# Patient Record
Sex: Male | Born: 1985 | Hispanic: Yes | Marital: Married | Smoking: Former smoker
Health system: Southern US, Community
[De-identification: ages and names within clinical notes are randomized; demographics above are authoritative.]

---
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
## 2011-03-25 ENCOUNTER — Emergency Department: Payer: Self-pay | Admitting: Emergency Medicine

## 2012-04-25 ENCOUNTER — Emergency Department: Payer: Self-pay | Admitting: Emergency Medicine

## 2012-05-06 ENCOUNTER — Emergency Department: Payer: Self-pay | Admitting: Emergency Medicine

## 2013-05-03 ENCOUNTER — Emergency Department: Payer: Self-pay | Admitting: Emergency Medicine

## 2013-08-09 ENCOUNTER — Emergency Department: Payer: Self-pay | Admitting: Emergency Medicine

## 2013-08-09 LAB — URINALYSIS, COMPLETE
Blood: NEGATIVE
Glucose,UR: NEGATIVE mg/dL (ref 0–75)
Leukocyte Esterase: NEGATIVE
Nitrite: NEGATIVE
Ph: 6 (ref 4.5–8.0)
RBC,UR: <1 /HPF (ref 0–5)
Squamous Epithelial: NONE SEEN
WBC UR: <1 /HPF (ref 0–5)

## 2013-08-09 LAB — CBC
MCH: 30.5 pg (ref 26.0–34.0)
MCHC: 35.4 g/dL (ref 32.0–36.0)
MCV: 86 fL (ref 80–100)
Platelet: 227 10*3/uL (ref 150–440)
RBC: 4.62 10*6/uL (ref 4.40–5.90)
RDW: 13.6 % (ref 11.5–14.5)
WBC: 9.1 10*3/uL (ref 3.8–10.6)

## 2013-08-09 LAB — COMPREHENSIVE METABOLIC PANEL
Alkaline Phosphatase: 78 U/L (ref 50–136)
Anion Gap: 5 — ABNORMAL LOW (ref 7–16)
BUN: 10 mg/dL (ref 7–18)
Bilirubin,Total: 0.5 mg/dL (ref 0.2–1.0)
Calcium, Total: 8.7 mg/dL (ref 8.5–10.1)
Creatinine: 0.93 mg/dL (ref 0.60–1.30)
EGFR (Non-African Amer.): >60
Glucose: 97 mg/dL (ref 65–99)
Osmolality: 278 (ref 275–301)
Potassium: 3.7 mmol/L (ref 3.5–5.1)
SGOT(AST): 37 U/L (ref 15–37)
Total Protein: 7.7 g/dL (ref 6.4–8.2)

## 2013-08-09 LAB — LIPASE, BLOOD: Lipase: 96 U/L (ref 73–393)

## 2013-11-30 ENCOUNTER — Emergency Department: Payer: Self-pay | Admitting: Emergency Medicine

## 2014-11-03 ENCOUNTER — Emergency Department: Payer: Self-pay | Admitting: Emergency Medicine

## 2014-11-03 LAB — BASIC METABOLIC PANEL
Anion Gap: 10 (ref 7–16)
BUN: 15 mg/dL (ref 7–18)
CHLORIDE: 106 mmol/L (ref 98–107)
CO2: 27 mmol/L (ref 21–32)
CREATININE: 1.07 mg/dL (ref 0.60–1.30)
Calcium, Total: 8.2 mg/dL — ABNORMAL LOW (ref 8.5–10.1)
EGFR (African American): >60
Glucose: 101 mg/dL — ABNORMAL HIGH (ref 65–99)
Osmolality: 286 (ref 275–301)
Potassium: 3.5 mmol/L (ref 3.5–5.1)
SODIUM: 143 mmol/L (ref 136–145)

## 2014-11-03 LAB — CBC
HCT: 44.1 % (ref 40.0–52.0)
HGB: 15 g/dL (ref 13.0–18.0)
MCH: 30.6 pg (ref 26.0–34.0)
MCHC: 33.9 g/dL (ref 32.0–36.0)
MCV: 90 fL (ref 80–100)
Platelet: 229 10*3/uL (ref 150–440)
RBC: 4.88 10*6/uL (ref 4.40–5.90)
RDW: 13.2 % (ref 11.5–14.5)
WBC: 10.8 10*3/uL — ABNORMAL HIGH (ref 3.8–10.6)

## 2014-11-03 LAB — TROPONIN I: Troponin-I: <0.02 ng/mL

## 2015-06-23 ENCOUNTER — Encounter (HOSPITAL_COMMUNITY): Payer: Self-pay

## 2015-06-23 ENCOUNTER — Emergency Department (HOSPITAL_COMMUNITY)
Admission: EM | Admit: 2015-06-23 | Discharge: 2015-06-23 | Disposition: A | Discharge status: Home / Self Care | Payer: Self-pay | Attending: Emergency Medicine | Admitting: Emergency Medicine

## 2015-06-23 ENCOUNTER — Emergency Department (HOSPITAL_COMMUNITY): Payer: Self-pay

## 2015-06-23 DIAGNOSIS — Z87891 Personal history of nicotine dependence: Secondary | ICD-10-CM | POA: Insufficient documentation

## 2015-06-23 DIAGNOSIS — R1031 Right lower quadrant pain: Secondary | ICD-10-CM | POA: Insufficient documentation

## 2015-06-23 LAB — CBC WITH DIFFERENTIAL/PLATELET
BASOS ABS: 0 10*3/uL (ref 0.0–0.1)
Basophils Relative: 1 % (ref 0–1)
Eosinophils Absolute: 0.3 10*3/uL (ref 0.0–0.7)
Eosinophils Relative: 3 % (ref 0–5)
HEMATOCRIT: 40.5 % (ref 39.0–52.0)
Hemoglobin: 14.3 g/dL (ref 13.0–17.0)
Lymphocytes Relative: 40 % (ref 12–46)
Lymphs Abs: 3.4 10*3/uL (ref 0.7–4.0)
MCH: 30.2 pg (ref 26.0–34.0)
MCHC: 35.3 g/dL (ref 30.0–36.0)
MCV: 85.6 fL (ref 78.0–100.0)
MONO ABS: 0.6 10*3/uL (ref 0.1–1.0)
Monocytes Relative: 7 % (ref 3–12)
Neutro Abs: 4.3 10*3/uL (ref 1.7–7.7)
Neutrophils Relative %: 49 % (ref 43–77)
PLATELETS: 231 10*3/uL (ref 150–400)
RBC: 4.73 MIL/uL (ref 4.22–5.81)
RDW: 13.4 % (ref 11.5–15.5)
WBC: 8.6 10*3/uL (ref 4.0–10.5)

## 2015-06-23 LAB — COMPREHENSIVE METABOLIC PANEL
ALBUMIN: 3.9 g/dL (ref 3.5–5.0)
ALK PHOS: 52 U/L (ref 38–126)
ALT: 32 U/L (ref 17–63)
ANION GAP: 5 (ref 5–15)
AST: 26 U/L (ref 15–41)
BUN: 12 mg/dL (ref 6–20)
CO2: 26 mmol/L (ref 22–32)
CREATININE: 0.93 mg/dL (ref 0.61–1.24)
Calcium: 9.1 mg/dL (ref 8.9–10.3)
Chloride: 108 mmol/L (ref 101–111)
GFR calc non Af Amer: >60 mL/min (ref >60)
GLUCOSE: 105 mg/dL — AB (ref 65–99)
POTASSIUM: 4.4 mmol/L (ref 3.5–5.1)
Sodium: 139 mmol/L (ref 135–145)
Total Bilirubin: 0.6 mg/dL (ref 0.3–1.2)
Total Protein: 7.3 g/dL (ref 6.5–8.1)

## 2015-06-23 LAB — URINALYSIS, ROUTINE W REFLEX MICROSCOPIC
Bilirubin Urine: NEGATIVE
Glucose, UA: NEGATIVE mg/dL
Hgb urine dipstick: NEGATIVE
Ketones, ur: NEGATIVE mg/dL
LEUKOCYTES UA: NEGATIVE
Nitrite: NEGATIVE
PH: 6 (ref 5.0–8.0)
PROTEIN: NEGATIVE mg/dL
SPECIFIC GRAVITY, URINE: 1.024 (ref 1.005–1.030)
UROBILINOGEN UA: 0.2 mg/dL (ref 0.0–1.0)

## 2015-06-23 LAB — LIPASE, BLOOD: Lipase: 18 U/L — ABNORMAL LOW (ref 22–51)

## 2015-06-23 MED ORDER — TRAMADOL HCL 50 MG PO TABS: 50.0000 mg | ORAL_TABLET | Freq: Four times a day (QID) | ORAL | Status: DC | PRN

## 2015-06-23 MED ORDER — SODIUM CHLORIDE 0.9 % IV BOLUS (SEPSIS)
1000.0000 mL | Freq: Once | INTRAVENOUS | Status: AC
  Administered 2015-06-23: 1000 mL via INTRAVENOUS

## 2015-06-23 MED ORDER — IOHEXOL 300 MG/ML  SOLN
100.0000 mL | Freq: Once | INTRAMUSCULAR | Status: AC | PRN
  Administered 2015-06-23: 100 mL via INTRAVENOUS

## 2015-06-23 MED ORDER — HYDROMORPHONE HCL 1 MG/ML IJ SOLN
1.0000 mg | Freq: Once | INTRAMUSCULAR | Status: AC
  Administered 2015-06-23: 1 mg via INTRAVENOUS
  Filled 2015-06-23: qty 1

## 2015-06-23 MED ORDER — IOHEXOL 300 MG/ML  SOLN
25.0000 mL | Freq: Once | INTRAMUSCULAR | Status: AC | PRN
  Administered 2015-06-23: 25 mL via ORAL

## 2015-06-23 MED ORDER — ONDANSETRON HCL 4 MG/2ML IJ SOLN
4.0000 mg | Freq: Once | INTRAMUSCULAR | Status: AC
  Administered 2015-06-23: 4 mg via INTRAVENOUS
  Filled 2015-06-23: qty 2

## 2015-06-23 NOTE — ED Notes (Signed)
Pt reports onset yesterday intermittant RLQ abd pain radiating to mid lower abd and nausea.

## 2015-06-27 NOTE — ED Provider Notes (Signed)
CSN: [id number]     Arrival date & time 06/23/15  1120 History   First MD Initiated Contact with Patient 06/23/15 1138     Chief Complaint  Patient presents with  . Abdominal Pain     (Consider location/radiation/quality/duration/timing/severity/associated sxs/prior Treatment) HPI   29 year old male with right lower quadrant pain. Onset yesterday. Initially intermittent. Now is fairly constant. Waxes and wanes but doesn't completely go away. Sharp at times and dull ache and others. No appreciable exacerbating relieving factors. No fevers or chills. No nausea or vomiting. No history of similar type pain. No urinary complaints. History reviewed. No pertinent past medical history. History reviewed. No pertinent past surgical history. History reviewed. No pertinent family history. History  Substance Use Topics  . Smoking status: Former Games developermoker  . Smokeless tobacco: Not on file  . Alcohol Use: No    Review of Systems  All systems reviewed and negative, other than as noted in HPI.   Allergies  Review of patient's allergies indicates no known allergies.  Home Medications   Prior to Admission medications   Medication Sig Start Date End Date Taking? Authorizing Provider  traMADol (ULTRAM) 50 MG tablet Take 1 tablet (50 mg total) by mouth every 6 (six) hours as needed. 06/23/15   Raeford RazorStephen Sandor Arboleda, MD   BP 102/65 mmHg  Pulse 58  Temp(Src) 97.5 F (36.4 C) (Oral)  Resp 18  SpO2 99% Physical Exam  Constitutional: He appears well-developed and well-nourished. No distress.  HENT:  Head: Normocephalic and atraumatic.  Eyes: Conjunctivae are normal. Right eye exhibits no discharge. Left eye exhibits no discharge.  Neck: Neck supple.  Cardiovascular: Normal rate, regular rhythm and normal heart sounds.  Exam reveals no gallop and no friction rub.   No murmur heard. Pulmonary/Chest: Effort normal and breath sounds normal. No respiratory distress.  Abdominal: Soft. He exhibits no  distension. There is tenderness.  Tenderness suprapubically in the right lower quadrant. No rebound or guarding. No distention. No hernia appreciated. No testicular tenderness or abnormal lie.  Musculoskeletal: He exhibits no edema or tenderness.  Neurological: He is alert.  Skin: Skin is warm and dry.  Psychiatric: He has a normal mood and affect. His behavior is normal. Thought content normal.  Nursing note and vitals reviewed.   ED Course  Procedures (including critical care time) Labs Review Labs Reviewed  COMPREHENSIVE METABOLIC PANEL - Abnormal; Notable for the following:    Glucose, Bld 105 (*)    All other components within normal limits  LIPASE, BLOOD - Abnormal; Notable for the following:    Lipase 18 (*)    All other components within normal limits  URINALYSIS, ROUTINE W REFLEX MICROSCOPIC (NOT [facility]) - Abnormal; Notable for the following:    APPearance CLOUDY (*)    All other components within normal limits  CBC WITH DIFFERENTIAL/PLATELET    Imaging Review No results found.   EKG Interpretation None      MDM   Final diagnoses:  Right lower quadrant abdominal pain   29 year old male with right lower quadrant pain. Etiology not completely clear. No hernia on exam. UA is unremarkable. CT abdomen and pelvis without acute abnormality. Does not appear to be emergent process. I feel is stable for discharge at this time. Return precautions were discussed.    Raeford RazorStephen Mikisha Roseland, MD 06/27/15 [phone]

## 2016-09-01 ENCOUNTER — Emergency Department
Admission: EM | Admit: 2016-09-01 | Discharge: 2016-09-01 | Disposition: A | Discharge status: Home / Self Care | Payer: Self-pay | Attending: Emergency Medicine | Admitting: Emergency Medicine

## 2016-09-01 ENCOUNTER — Emergency Department: Payer: Self-pay

## 2016-09-01 DIAGNOSIS — Z87891 Personal history of nicotine dependence: Secondary | ICD-10-CM | POA: Insufficient documentation

## 2016-09-01 DIAGNOSIS — R1084 Generalized abdominal pain: Secondary | ICD-10-CM

## 2016-09-01 LAB — COMPREHENSIVE METABOLIC PANEL
ALT: 39 U/L (ref 17–63)
ANION GAP: 6 (ref 5–15)
AST: 30 U/L (ref 15–41)
Albumin: 4.7 g/dL (ref 3.5–5.0)
Alkaline Phosphatase: 57 U/L (ref 38–126)
BUN: 17 mg/dL (ref 6–20)
CHLORIDE: 109 mmol/L (ref 101–111)
CO2: 23 mmol/L (ref 22–32)
Calcium: 9.4 mg/dL (ref 8.9–10.3)
Creatinine, Ser: 0.95 mg/dL (ref 0.61–1.24)
GFR calc Af Amer: >60 mL/min (ref >60)
GFR calc non Af Amer: >60 mL/min (ref >60)
Glucose, Bld: 105 mg/dL — ABNORMAL HIGH (ref 65–99)
Potassium: 4.1 mmol/L (ref 3.5–5.1)
SODIUM: 138 mmol/L (ref 135–145)
Total Bilirubin: 0.7 mg/dL (ref 0.3–1.2)
Total Protein: 8.2 g/dL — ABNORMAL HIGH (ref 6.5–8.1)

## 2016-09-01 LAB — URINALYSIS COMPLETE WITH MICROSCOPIC (ARMC ONLY)
BACTERIA UA: NONE SEEN
Bilirubin Urine: NEGATIVE
Glucose, UA: NEGATIVE mg/dL
Hgb urine dipstick: NEGATIVE
Ketones, ur: NEGATIVE mg/dL
Leukocytes, UA: NEGATIVE
Nitrite: NEGATIVE
PH: 5 (ref 5.0–8.0)
PROTEIN: NEGATIVE mg/dL
SQUAMOUS EPITHELIAL / LPF: NONE SEEN
Specific Gravity, Urine: 1.021 (ref 1.005–1.030)

## 2016-09-01 LAB — CBC
HCT: 44.2 % (ref 40.0–52.0)
Hemoglobin: 15.7 g/dL (ref 13.0–18.0)
MCH: 30.8 pg (ref 26.0–34.0)
MCHC: 35.4 g/dL (ref 32.0–36.0)
MCV: 87 fL (ref 80.0–100.0)
Platelets: 245 10*3/uL (ref 150–440)
RBC: 5.08 MIL/uL (ref 4.40–5.90)
RDW: 13.4 % (ref 11.5–14.5)
WBC: 9.5 10*3/uL (ref 3.8–10.6)

## 2016-09-01 LAB — TROPONIN I: Troponin I: <0.03 ng/mL (ref <0.03)

## 2016-09-01 LAB — LIPASE, BLOOD: LIPASE: 24 U/L (ref 11–51)

## 2016-09-01 MED ORDER — RANITIDINE HCL 150 MG PO TABS: 150.0000 mg | ORAL_TABLET | Freq: Two times a day (BID) | ORAL | 0 refills | Status: DC

## 2016-09-01 MED ORDER — MORPHINE SULFATE (PF) 4 MG/ML IV SOLN
4.0000 mg | Freq: Once | INTRAVENOUS | Status: AC
  Administered 2016-09-01: 4 mg via INTRAVENOUS

## 2016-09-01 MED ORDER — ONDANSETRON HCL 4 MG/2ML IJ SOLN
4.0000 mg | Freq: Once | INTRAMUSCULAR | Status: AC
  Administered 2016-09-01: 4 mg via INTRAVENOUS

## 2016-09-01 MED ORDER — ONDANSETRON HCL 4 MG/2ML IJ SOLN
INTRAMUSCULAR | Status: AC
  Administered 2016-09-01: 4 mg via INTRAVENOUS
  Filled 2016-09-01: qty 2

## 2016-09-01 MED ORDER — IOPAMIDOL (ISOVUE-300) INJECTION 61%
30.0000 mL | Freq: Once | INTRAVENOUS | Status: AC | PRN
  Administered 2016-09-01: 30 mL via ORAL

## 2016-09-01 MED ORDER — OXYCODONE-ACETAMINOPHEN 5-325 MG PO TABS: 1.0000 | ORAL_TABLET | ORAL | Status: DC | PRN

## 2016-09-01 MED ORDER — IOPAMIDOL (ISOVUE-300) INJECTION 61%
100.0000 mL | Freq: Once | INTRAVENOUS | Status: AC | PRN
  Administered 2016-09-01: 100 mL via INTRAVENOUS

## 2016-09-01 MED ORDER — MORPHINE SULFATE (PF) 4 MG/ML IV SOLN
INTRAVENOUS | Status: AC
  Administered 2016-09-01: 4 mg via INTRAVENOUS
  Filled 2016-09-01: qty 1

## 2016-09-01 MED ORDER — HYDROMORPHONE HCL 1 MG/ML IJ SOLN
1.0000 mg | Freq: Once | INTRAMUSCULAR | Status: AC
  Administered 2016-09-01: 1 mg via INTRAVENOUS
  Filled 2016-09-01: qty 1

## 2016-09-01 NOTE — ED Triage Notes (Signed)
Pt c/o mid/upper abd pain since yesterday .Marland Kitchen. Denies N/V/D, states last BM was last night, normal.. Pt states pain is worse with movement.. States he tried Pepto without any relief

## 2016-09-01 NOTE — ED Notes (Signed)
MD at bedside. 

## 2016-09-01 NOTE — ED Provider Notes (Signed)
[facility] Emergency Department Provider Note ____________________________________________   I have reviewed the triage vital signs and the triage nursing note.  HISTORY  Chief Complaint Abdominal Pain   Historian Patient and his friend  HPI Tony Shelton is a 30 y.o. male here for evaluation of mid abdominal pain which started last night and has persisted although somewhat waxing and waning over the course of the night and the day today. He's had nausea without vomiting. No black or bloody stools. No diarrhea. No fever. No history of similar abdominal pain. Friend states that they pressed on his abdomen and although he is pointing to his epigastrium, he is tender all over. Pain is moderate to severe and persistent at present time.  Movement seems to make it worse.    History reviewed. No pertinent past medical history.  There are no active problems to display for this patient.   History reviewed. No pertinent surgical history.  Prior to Admission medications   Medication Sig Start Date End Date Taking? Authorizing Provider  traMADol (ULTRAM) 50 MG tablet Take 1 tablet (50 mg total) by mouth every 6 (six) hours as needed. 06/23/15   Raeford Razor, MD    No Known Allergies  No family history on file.  Social History Social History  Substance Use Topics  . Smoking status: Former Games developer  . Smokeless tobacco: Never Used  . Alcohol use Yes    Review of Systems  Constitutional: Negative for fever. Eyes: Negative for visual changes. ENT: Negative for sore throat. Cardiovascular: Negative for chest pain. Respiratory: Negative for shortness of breath. Gastrointestinal: Nausea for abdominal pain, without vomiting and diarrhea. Genitourinary: Negative for dysuria. Musculoskeletal: Negative for back pain. Skin: Negative for rash. Neurological: Negative for headache. 10 point Review of Systems otherwise  negative ____________________________________________   PHYSICAL EXAM:  VITAL SIGNS: ED Triage Vitals [09/01/16 1319]  Enc Vitals Group     BP 122/67     Pulse Rate 76     Resp 18     Temp 97.6 F (36.4 C)     Temp Source Oral     SpO2 98 %     Weight 240 lb (108.9 kg)     Height 5\' 4"  (1.626 m)     Head Circumference      Peak Flow      Pain Score 10     Pain Loc      Pain Edu?      Excl. in GC?      Constitutional: Alert and oriented. Well appearing Overall the company of abdominal pain. HEENT   Head: Normocephalic and atraumatic.      Eyes: Conjunctivae are normal. PERRL. Normal extraocular movements.      Ears:         Nose: No congestion/rhinnorhea.   Mouth/Throat: Mucous membranes are moist.   Neck: No stridor. Cardiovascular/Chest: Normal rate, regular rhythm.  No murmurs, rubs, or gallops. Respiratory: Normal respiratory effort without tachypnea nor retractions. Breath sounds are clear and equal bilaterally. No wheezes/rales/rhonchi. Gastrointestinal: Soft.  Mild distended and diffusely tender in all 4 quadrants without guarding, rebound.  Genitourinary/rectal:Deferred  Musculoskeletal: Nontender with normal range of motion in all extremities. No joint effusions.  No lower extremity tenderness.  No edema. Neurologic:  Normal speech and language. No gross or focal neurologic deficits are appreciated. Skin:  Skin is warm, dry and intact. No rash noted. Psychiatric: Mood and affect are normal. Speech and behavior are normal. Patient exhibits appropriate  insight and judgment.  ____________________________________________   EKG I, Governor Rooksebecca Cameo Shewell, MD, the attending physician have personally viewed and interpreted all ECGs.  70 bpm. Normal sinus rhythm. Narrow distress. Normal axis. Nonspecific T wave. ____________________________________________  LABS (pertinent positives/negatives)  Labs Reviewed  COMPREHENSIVE METABOLIC PANEL - Abnormal; Notable for  the following:       Result Value   Glucose, Bld 105 (*)    Total Protein 8.2 (*)    All other components within normal limits  URINALYSIS COMPLETEWITH MICROSCOPIC ([facility] ONLY) - Abnormal; Notable for the following:    Color, Urine YELLOW (*)    APPearance CLEAR (*)    All other components within normal limits  LIPASE, BLOOD  CBC  TROPONIN I    ____________________________________________  RADIOLOGY All Xrays were viewed by me. Imaging interpreted by Radiologist.  CT abdomen and pelvis with contrast: Pending __________________________________________  PROCEDURES  Procedure(s) performed: None  Critical Care performed: None  ____________________________________________   ED COURSE / ASSESSMENT AND PLAN  Pertinent labs & imaging results that were available during my care of the patient were reviewed by me and considered in my medical decision making (see chart for details).   Tony Shelton is here with abdominal pain overnight. When he was discussing his history, it sounded like potentially indigestion/GERD, but on exam, he is diffusely tender in his abdomen, enough so that I am recommending CT scan for further evaluation.  Laboratory studies are reassuring.  No focal RUQ pain, LFTs normal, and I am less suspicious for a biliary emergency.  Patient care transferred to Dr. Langston MaskerShaevitz at shift change, 3:15 PM. If CT scan is reassuring, my impression is gastritis/indigestion.   CONSULTATIONS:  None   Patient / Family / Caregiver informed of clinical course, medical decision-making process, and agree with plan.  ___________________________________________   FINAL CLINICAL IMPRESSION(S) / ED DIAGNOSES   Final diagnoses:  Generalized abdominal pain              Note: This dictation was prepared with Dragon dictation. Any transcriptional errors that result from this process are unintentional    Governor Rooksebecca Jariyah Hackley, MD 09/01/16 1513

## 2016-09-01 NOTE — ED Notes (Signed)
Pt denies n/v/d 

## 2016-09-01 NOTE — ED Provider Notes (Signed)
Patient signed out from Dr. Shaune PollackLord with diffuse abdominal tenderness. Very reassuring lab work with a history that led Dr. Shaune PollackLord to think that the patient was having gastritis because of him pointing to his epigastrium when he was asked to identify the location of his pain. Plan is to follow up with the patient's CAT scan results. The patient has a reassuring CAT scan then the plan is to discharge home with an antacid. Physical Exam  BP 119/78   Pulse (!) 52   Temp 97.6 F (36.4 C) (Oral)   Resp 15   Ht 5\' 4"  (1.626 m)   Wt 240 lb (108.9 kg)   SpO2 100%   BMI 41.20 kg/m  ----------------------------------------- 4:22 PM on 09/01/2016 -----------------------------------------   Physical Exam Patient still diffusely tender but very distractible and when I am speaking with him and pushing on his belly at the same time he has no reaction. Belly is soft and without any focal tenderness. ED Course  Procedures  CT Abdomen Pelvis W Contrast (Accession [id number]) (Order [id number])  Imaging  Date: 09/01/2016 Department: [facility] EMERGENCY DEPARTMENT Released By/Authorizing: Governor Rooksebecca Lord, MD (auto-released)  PACS Images   Show images for CT Abdomen Pelvis W Contrast  Study Result   CLINICAL DATA:  Mid to upper abdominal pain since yesterday.  EXAM: CT ABDOMEN AND PELVIS WITH CONTRAST  TECHNIQUE: Multidetector CT imaging of the abdomen and pelvis was performed using the standard protocol following bolus administration of intravenous contrast.  CONTRAST:  100mL ISOVUE-300 IOPAMIDOL (ISOVUE-300) INJECTION 61%  COMPARISON:  06/23/2015  FINDINGS: Lower chest: The lung bases are clear of acute process. Minimal dependent subpleural atelectasis No pleural effusion or pulmonary lesions. The heart is normal in size. No pericardial effusion. The distal esophagus and aorta are unremarkable.  Hepatobiliary: Diffuse fatty infiltration of the liver. No focal hepatic  lesions or intrahepatic biliary dilatation. The gallbladder is normal. No common bile duct dilatation.  Pancreas: No mass, inflammation or ductal dilatation.  Spleen: Normal size.  No focal lesions.  Adrenals/Urinary Tract: The adrenal glands and kidneys are normal.  Stomach/Bowel: The stomach, duodenum, small bowel and colon are unremarkable. No inflammatory changes, mass lesions or obstructive findings. The terminal ileum is normal. The appendix is normal.  Vascular/Lymphatic: The aorta is normal in caliber. No atherosclerotic calcifications. The branch vessels are normal. The major venous structures are patent. There are small scattered mesenteric and retroperitoneal lymph nodes but no mass or overt adenopathy. These are stable.  Other: The bladder, prostate gland and seminal vesicles are normal. No pelvic mass or adenopathy. No free pelvic fluid collections. No inguinal mass or adenopathy.  Musculoskeletal: No significant bony findings.  IMPRESSION: No acute abdominal/ pelvic findings, mass lesions or adenopathy. No CT findings to account for the patient's abdominal pain are identified.   Electronically Signed   By: Rudie MeyerP.  Gallerani M.D.   On: 09/01/2016 15:25      MDM Patient with very reassuring CAT scan as well as blood work and EKG. Will be discharged home with Zantac. I also asked the patient if there is any possibility had done any heavy lifting or bending that would've caused the abdominal wall strain and he denies. We reviewed his imaging study and he is understanding of the plan and willing to comply. Will be following up with Phineas Realharles Drew.       Myrna Blazeravid Matthew Ayliana Casciano, MD 09/01/16 [phone]

## 2018-02-15 ENCOUNTER — Ambulatory Visit (INDEPENDENT_AMBULATORY_CARE_PROVIDER_SITE_OTHER): Payer: Self-pay | Admitting: Physician Assistant

## 2018-02-15 ENCOUNTER — Encounter (INDEPENDENT_AMBULATORY_CARE_PROVIDER_SITE_OTHER): Payer: Self-pay | Admitting: Physician Assistant

## 2018-02-15 VITALS — BP 127/84 | HR 70 | Temp 97.9°F | Resp 18 | Ht 64.96 in | Wt 257.0 lb

## 2018-02-15 DIAGNOSIS — R5383 Other fatigue: Secondary | ICD-10-CM

## 2018-02-15 DIAGNOSIS — F0630 Mood disorder due to known physiological condition, unspecified: Secondary | ICD-10-CM

## 2018-02-15 DIAGNOSIS — Z131 Encounter for screening for diabetes mellitus: Secondary | ICD-10-CM

## 2018-02-15 DIAGNOSIS — Z6841 Body Mass Index (BMI) 40.0 and over, adult: Secondary | ICD-10-CM

## 2018-02-15 DIAGNOSIS — R519 Headache, unspecified: Secondary | ICD-10-CM

## 2018-02-15 DIAGNOSIS — R51 Headache: Secondary | ICD-10-CM

## 2018-02-15 DIAGNOSIS — R079 Chest pain, unspecified: Secondary | ICD-10-CM

## 2018-02-15 LAB — POCT GLYCOSYLATED HEMOGLOBIN (HGB A1C): Hemoglobin A1C: 5.4

## 2018-02-15 MED ORDER — TOPIRAMATE 25 MG PO TABS: 25.0000 mg | ORAL_TABLET | Freq: Two times a day (BID) | ORAL | 2 refills | Status: DC

## 2018-02-15 MED ORDER — VITAMIN D-3 125 MCG (5000 UT) PO TABS: 1.0000 | ORAL_TABLET | Freq: Every day | ORAL | 0 refills | Status: DC

## 2018-02-15 NOTE — Progress Notes (Signed)
Subjective:  Patient ID: Tony Shelton, male    DOB: [birth year]  Age: 32 y.o. MRN: [id number]  CC: establish care  HPI Tony Shelton is a 32 y.o. male with no significant medical history presents with complaint of fatigue daily bitemporal headaches that are sometimes relieved with ibuprofen. Headache is sometimes worse if he does not have coffee or sugary drink in the morning. Has been having occasional diaphoresis. Rare feeling of chest "cramp" that lasts for approximately two minutes, last episode within six months ago. Thinks he may have lost consciousness one time three days ago. Reports feeling "really hot and having a headache" before witnessed syncope and collapse. Did not go to the hospital. Had cold like symptoms approximately two weeks. Does not endorse current CP, palpitations, SOB, substance abuse, swelling, abdominal pain, rash, f/c/n/v, or GI/GU sxs. French Ana, a friend for 8 years, accompanies patient and says pt has been having mood swings over the past six months. PHQ9 16 and GAD7 11 in clinic today. Has been highly irritable and irritation/anger can be explosive.      Outpatient Medications Prior to Visit  Medication Sig Dispense Refill  . ranitidine (ZANTAC) 150 MG tablet Take 1 tablet (150 mg total) by mouth 2 (two) times daily. 60 tablet 0  . traMADol (ULTRAM) 50 MG tablet Take 1 tablet (50 mg total) by mouth every 6 (six) hours as needed. (Patient not taking: Reported on 02/15/2018) 8 tablet 0   No facility-administered medications prior to visit.      ROS Review of Systems  Constitutional: Positive for malaise/fatigue. Negative for chills and fever.  Eyes: Negative for blurred vision.  Respiratory: Negative for shortness of breath.   Cardiovascular: Positive for chest pain. Negative for palpitations.  Gastrointestinal: Negative for abdominal pain and nausea.  Genitourinary: Negative for dysuria and hematuria.  Musculoskeletal: Negative for  joint pain and myalgias.  Skin: Negative for rash.  Neurological: Positive for headaches. Negative for tingling.  Endo/Heme/Allergies:       Diaphoresis  Psychiatric/Behavioral: Negative for depression and substance abuse. The patient is nervous/anxious.     Objective:  BP 127/84 (BP Location: Right Arm, Patient Position: Sitting, Cuff Size: Large)   Pulse 70   Temp 97.9 F (36.6 C) (Oral)   Resp 18   Ht 5' 4.96" (1.65 m)   Wt 257 lb (116.6 kg)   SpO2 99%   BMI 42.82 kg/m   BP/Weight 02/15/2018 09/01/2016 06/23/2015  Systolic BP 127 109 102  Diastolic BP 84 69 65  Wt. (Lbs) 257 240 -  BMI 42.82 41.2 -      Physical Exam  Constitutional: He is oriented to person, place, and time.  Well developed, obese, NAD, polite  HENT:  Head: Normocephalic and atraumatic.  Eyes: Conjunctivae are normal. Pupils are equal, round, and reactive to light. No scleral icterus.  Neck: Normal range of motion. Neck supple. No thyromegaly present.  Cardiovascular: Normal rate, regular rhythm, normal heart sounds and intact distal pulses.  No LE edema bilaterally  Pulmonary/Chest: Effort normal and breath sounds normal. No respiratory distress. He has no wheezes.  Abdominal: Soft. Bowel sounds are normal. He exhibits no distension. There is no tenderness.  No hepatosplenomegaly  Musculoskeletal: He exhibits no edema.  Lymphadenopathy:    He has no cervical adenopathy.  Neurological: He is alert and oriented to person, place, and time. No cranial nerve deficit. Coordination normal.  Skin: Skin is warm and dry. No rash noted. No erythema. No pallor.  Clammy hands. Not generally diaphoretic.  Psychiatric: He has a normal mood and affect. His behavior is normal. Thought content normal.  Vitals reviewed.    Assessment & Plan:   1. Nonintractable headache, unspecified chronicity pattern, unspecified headache type - Begin topiramate (TOPAMAX) 25 MG tablet; Take 1 tablet (25 mg total) by mouth 2  (two) times daily.  Dispense: 30 tablet; Refill: 2 - Comprehensive metabolic panel - CBC with Differential - TSH - Lipid panel  2. Fatigue, unspecified type - TSH - Lipid panel - Begin Cholecalciferol (VITAMIN D-3) 5000 units TABS; Take 1 tablet by mouth daily.  Dispense: 30 tablet; Refill: 0  3. Chest pain, unspecified type - EKG 12-Lead. Sent to [facility]  4. Class 3 severe obesity without serious comorbidity with body mass index (BMI) of 40.0 to 44.9 in adult, unspecified obesity type (HCC) - TSH - Lipid panel  5. Screening for diabetes mellitus - HgB A1c 5.4%  6. Mood disorder in conditions classified elsewhere  - PHQ9 16 and GAD7 11 in clinic today. Can not yet r/o thyroid etiology of mood disorder.   Meds ordered this encounter  Medications  . topiramate (TOPAMAX) 25 MG tablet    Sig: Take 1 tablet (25 mg total) by mouth 2 (two) times daily.    Dispense:  30 tablet    Refill:  2    Order Specific Question:   Supervising Provider    Answer:   Quentin AngstJEGEDE, OLUGBEMIGA E [id number]]  . Cholecalciferol (VITAMIN D-3) 5000 units TABS    Sig: Take 1 tablet by mouth daily.    Dispense:  30 tablet    Refill:  0    Order Specific Question:   Supervising Provider    Answer:   Quentin AngstJEGEDE, OLUGBEMIGA E [[id number]]    Follow-up: Return in about 2 weeks (around 03/01/2018) for Headache f/u, general health maintenance.   Loletta Specteroger David Mazin Emma PA

## 2018-02-15 NOTE — Patient Instructions (Addendum)
General Headache Without Cause A headache is pain or discomfort felt around the head or neck area. There are many causes and types of headaches. In some cases, the cause may not be found. Follow these instructions at home: Managing pain  Take over-the-counter and prescription medicines only as told by your doctor.  Lie down in a dark, quiet room when you have a headache.  If directed, apply ice to the head and neck area: ? Put ice in a plastic bag. ? Place a towel between your skin and the bag. ? Leave the ice on for 20 minutes, 2-3 times per day.  Use a heating pad or hot shower to apply heat to the head and neck area as told by your doctor.  Keep lights dim if bright lights bother you or make your headaches worse. Eating and drinking  Eat meals on a regular schedule.  Lessen how much alcohol you drink.  Lessen how much caffeine you drink, or stop drinking caffeine. General instructions  Keep all follow-up visits as told by your doctor. This is important.  Keep a journal to find out if certain things bring on headaches. For example, write down: ? What you eat and drink. ? How much sleep you get. ? Any change to your diet or medicines.  Relax by getting a massage or doing other relaxing activities.  Lessen stress.  Sit up straight. Do not tighten (tense) your muscles.  Do not use tobacco products. This includes cigarettes, chewing tobacco, or e-cigarettes. If you need help quitting, ask your doctor.  Exercise regularly as told by your doctor.  Get enough sleep. This often means 7-9 hours of sleep. Contact a doctor if:  Your symptoms are not helped by medicine.  You have a headache that feels different than the other headaches.  You feel sick to your stomach (nauseous) or you throw up (vomit).  You have a fever. Get help right away if:  Your headache becomes really bad.  You keep throwing up.  You have a stiff neck.  You have trouble seeing.  You have  trouble speaking.  You have pain in the eye or ear.  Your muscles are weak or you lose muscle control.  You lose your balance or have trouble walking.  You feel like you will pass out (faint) or you pass out.  You have confusion. This information is not intended to replace advice given to you by your health care provider. Make sure you discuss any questions you have with your health care provider. Document Released: 09/23/2008 Document Revised: 05/22/2016 Document Reviewed: 04/09/2015 Elsevier Interactive Patient Education  2018 Elsevier Inc.   Generalized Anxiety Disorder, Adult Generalized anxiety disorder (GAD) is a mental health disorder. People with this condition constantly worry about everyday events. Unlike normal anxiety, worry related to GAD is not triggered by a specific event. These worries also do not fade or get better with time. GAD interferes with life functions, including relationships, work, and school. GAD can vary from mild to severe. People with severe GAD can have intense waves of anxiety with physical symptoms (panic attacks). What are the causes? The exact cause of GAD is not known. What increases the risk? This condition is more likely to develop in:  Women.  People who have a family history of anxiety disorders.  People who are very shy.  People who experience very stressful life events, such as the death of a loved one.  People who have a very stressful family environment.  What are the signs or symptoms? People with GAD often worry excessively about many things in their lives, such as their health and family. They may also be overly concerned about:  Doing well at work.  Being on time.  Natural disasters.  Friendships.  Physical symptoms of GAD include:  Fatigue.  Muscle tension or having muscle twitches.  Trembling or feeling shaky.  Being easily startled.  Feeling like your heart is pounding or racing.  Feeling out of breath or  like you cannot take a deep breath.  Having trouble falling asleep or staying asleep.  Sweating.  Nausea, diarrhea, or irritable bowel syndrome (IBS).  Headaches.  Trouble concentrating or remembering facts.  Restlessness.  Irritability.  How is this diagnosed? Your health care provider can diagnose GAD based on your symptoms and medical history. You will also have a physical exam. The health care provider will ask specific questions about your symptoms, including how severe they are, when they started, and if they come and go. Your health care provider may ask you about your use of alcohol or drugs, including prescription medicines. Your health care provider may refer you to a mental health specialist for further evaluation. Your health care provider will do a thorough examination and may perform additional tests to rule out other possible causes of your symptoms. To be diagnosed with GAD, a person must have anxiety that:  Is out of his or her control.  Affects several different aspects of his or her life, such as work and relationships.  Causes distress that makes him or her unable to take part in normal activities.  Includes at least three physical symptoms of GAD, such as restlessness, fatigue, trouble concentrating, irritability, muscle tension, or sleep problems.  Before your health care provider can confirm a diagnosis of GAD, these symptoms must be present more days than they are not, and they must last for six months or longer. How is this treated? The following therapies are usually used to treat GAD:  Medicine. Antidepressant medicine is usually prescribed for long-term daily control. Antianxiety medicines may be added in severe cases, especially when panic attacks occur.  Talk therapy (psychotherapy). Certain types of talk therapy can be helpful in treating GAD by providing support, education, and guidance. Options include: ? Cognitive behavioral therapy (CBT). People  learn coping skills and techniques to ease their anxiety. They learn to identify unrealistic or negative thoughts and behaviors and to replace them with positive ones. ? Acceptance and commitment therapy (ACT). This treatment teaches people how to be mindful as a way to cope with unwanted thoughts and feelings. ? Biofeedback. This process trains you to manage your body's response (physiological response) through breathing techniques and relaxation methods. You will work with a therapist while machines are used to monitor your physical symptoms.  Stress management techniques. These include yoga, meditation, and exercise.  A mental health specialist can help determine which treatment is best for you. Some people see improvement with one type of therapy. However, other people require a combination of therapies. Follow these instructions at home:  Take over-the-counter and prescription medicines only as told by your health care provider.  Try to maintain a normal routine.  Try to anticipate stressful situations and allow extra time to manage them.  Practice any stress management or self-calming techniques as taught by your health care provider.  Do not punish yourself for setbacks or for not making progress.  Try to recognize your accomplishments, even if they are small.  Keep all follow-up visits as told by your health care provider. This is important. Contact a health care provider if:  Your symptoms do not get better.  Your symptoms get worse.  You have signs of depression, such as: ? A persistently sad, cranky, or irritable mood. ? Loss of enjoyment in activities that used to bring you joy. ? Change in weight or eating. ? Changes in sleeping habits. ? Avoiding friends or family members. ? Loss of energy for normal tasks. ? Feelings of guilt or worthlessness. Get help right away if:  You have serious thoughts about hurting yourself or others. If you ever feel like you may hurt  yourself or others, or have thoughts about taking your own life, get help right away. You can go to your nearest emergency department or call:  Your local emergency services (911 in the [place]  A suicide crisis helpline, such as the National Suicide Prevention Lifeline at (831)204-51061-308-202-4829. This is open 24 hours a day.  Summary  Generalized anxiety disorder (GAD) is a mental health disorder that involves worry that is not triggered by a specific event.  People with GAD often worry excessively about many things in their lives, such as their health and family.  GAD may cause physical symptoms such as restlessness, trouble concentrating, sleep problems, frequent sweating, nausea, diarrhea, headaches, and trembling or muscle twitching.  A mental health specialist can help determine which treatment is best for you. Some people see improvement with one type of therapy. However, other people require a combination of therapies. This information is not intended to replace advice given to you by your health care provider. Make sure you discuss any questions you have with your health care provider. Document Released: 04/11/2013 Document Revised: 11/04/2016 Document Reviewed: 11/04/2016 Elsevier Interactive Patient Education  [employer]2018 Elsevier Inc.

## 2018-02-16 ENCOUNTER — Telehealth (INDEPENDENT_AMBULATORY_CARE_PROVIDER_SITE_OTHER): Payer: Self-pay | Admitting: *Deleted

## 2018-02-16 LAB — COMPREHENSIVE METABOLIC PANEL
ALT: 60 IU/L — ABNORMAL HIGH (ref 0–44)
AST: 42 IU/L — AB (ref 0–40)
Albumin/Globulin Ratio: 1.4 (ref 1.2–2.2)
Albumin: 4.6 g/dL (ref 3.5–5.5)
Alkaline Phosphatase: 70 IU/L (ref 39–117)
BUN/Creatinine Ratio: 9 (ref 9–20)
BUN: 8 mg/dL (ref 6–20)
Bilirubin Total: 0.4 mg/dL (ref 0.0–1.2)
CO2: 23 mmol/L (ref 20–29)
CREATININE: 0.9 mg/dL (ref 0.76–1.27)
Calcium: 9.3 mg/dL (ref 8.7–10.2)
Chloride: 102 mmol/L (ref 96–106)
GFR, EST AFRICAN AMERICAN: 133 mL/min/{1.73_m2} (ref >59)
GFR, EST NON AFRICAN AMERICAN: 115 mL/min/{1.73_m2} (ref >59)
GLUCOSE: 86 mg/dL (ref 65–99)
Globulin, Total: 3.2 g/dL (ref 1.5–4.5)
POTASSIUM: 4.6 mmol/L (ref 3.5–5.2)
Sodium: 141 mmol/L (ref 134–144)
TOTAL PROTEIN: 7.8 g/dL (ref 6.0–8.5)

## 2018-02-16 LAB — CBC WITH DIFFERENTIAL/PLATELET
BASOS: 0 %
Basophils Absolute: 0 10*3/uL (ref 0.0–0.2)
EOS (ABSOLUTE): 0.2 10*3/uL (ref 0.0–0.4)
Eos: 3 %
HEMOGLOBIN: 15.2 g/dL (ref 13.0–17.7)
Hematocrit: 43.8 % (ref 37.5–51.0)
Immature Grans (Abs): 0 10*3/uL (ref 0.0–0.1)
Immature Granulocytes: 0 %
LYMPHS: 41 %
Lymphocytes Absolute: 3 10*3/uL (ref 0.7–3.1)
MCH: 30.9 pg (ref 26.6–33.0)
MCHC: 34.7 g/dL (ref 31.5–35.7)
MCV: 89 fL (ref 79–97)
Monocytes Absolute: 0.4 10*3/uL (ref 0.1–0.9)
Monocytes: 5 %
NEUTROS ABS: 3.7 10*3/uL (ref 1.4–7.0)
NEUTROS PCT: 51 %
Platelets: 278 10*3/uL (ref 150–379)
RBC: 4.92 x10E6/uL (ref 4.14–5.80)
RDW: 14.1 % (ref 12.3–15.4)
WBC: 7.3 10*3/uL (ref 3.4–10.8)

## 2018-02-16 LAB — LIPID PANEL
CHOL/HDL RATIO: 3.5 ratio (ref 0.0–5.0)
Cholesterol, Total: 159 mg/dL (ref 100–199)
HDL: 45 mg/dL (ref >39)
LDL Calculated: 94 mg/dL (ref 0–99)
Triglycerides: 99 mg/dL (ref 0–149)
VLDL CHOLESTEROL CAL: 20 mg/dL (ref 5–40)

## 2018-02-16 LAB — TSH: TSH: 1.38 u[IU]/mL (ref 0.450–4.500)

## 2018-02-16 NOTE — Telephone Encounter (Signed)
-----   Message from Loletta Specteroger David Gomez, PA-C sent at 02/16/2018  1:50 PM EST ----- Signs of liver inflammation. Rest of labs normal. I will f/u on liver enzymes at his f/u in a few weeks.

## 2018-02-16 NOTE — Telephone Encounter (Signed)
Patient verified DOB Patient is aware of liver inflammation being noted and PCP wanting to recheck at the next visit. All other lab are normal No further questions.

## 2018-03-01 ENCOUNTER — Ambulatory Visit (INDEPENDENT_AMBULATORY_CARE_PROVIDER_SITE_OTHER): Payer: Self-pay | Admitting: Physician Assistant

## 2018-03-01 ENCOUNTER — Encounter (INDEPENDENT_AMBULATORY_CARE_PROVIDER_SITE_OTHER): Payer: Self-pay | Admitting: Physician Assistant

## 2018-03-01 ENCOUNTER — Other Ambulatory Visit: Payer: Self-pay

## 2018-03-01 VITALS — BP 136/82 | HR 67 | Temp 97.4°F | Wt 257.6 lb

## 2018-03-01 DIAGNOSIS — Z23 Encounter for immunization: Secondary | ICD-10-CM

## 2018-03-01 DIAGNOSIS — R400 Somnolence: Secondary | ICD-10-CM

## 2018-03-01 DIAGNOSIS — R519 Headache, unspecified: Secondary | ICD-10-CM

## 2018-03-01 DIAGNOSIS — R51 Headache: Secondary | ICD-10-CM

## 2018-03-01 DIAGNOSIS — Z114 Encounter for screening for human immunodeficiency virus [HIV]: Secondary | ICD-10-CM

## 2018-03-01 DIAGNOSIS — Z6841 Body Mass Index (BMI) 40.0 and over, adult: Secondary | ICD-10-CM

## 2018-03-01 DIAGNOSIS — R748 Abnormal levels of other serum enzymes: Secondary | ICD-10-CM

## 2018-03-01 NOTE — Patient Instructions (Signed)

## 2018-03-01 NOTE — Progress Notes (Signed)
Subjective:  Patient ID: Tony Shelton, male    DOB: 07/27/1986  Age: 32 y.o. MRN: [id number]  CC: f/u headache  HPI Tony Shelton is a 32 y.o. male with a medical history of headaches presents on f/u of fatigue and bitemporal headaches. Seen here approximately 16 days ago and diagnosed with nonintractable headache unspecified. Labs normal for lipid panel, TSH, CBC. CMP revealed AST 42 and ALT 60.  Prescribed Topiramate and says there was no relief of headache. Continues with bitemporal headache. Headache upon awakening in the morning. Snoring. Reports daytime somnolence and obesity. Does not report other symptoms at this time.    Outpatient Medications Prior to Visit  Medication Sig Dispense Refill  . Cholecalciferol (VITAMIN D-3) 5000 units TABS Take 1 tablet by mouth daily. 30 tablet 0  . topiramate (TOPAMAX) 25 MG tablet Take 1 tablet (25 mg total) by mouth 2 (two) times daily. 30 tablet 2   No facility-administered medications prior to visit.      ROS Review of Systems  Constitutional: Positive for malaise/fatigue. Negative for chills and fever.  Eyes: Negative for blurred vision.  Respiratory: Negative for shortness of breath.   Cardiovascular: Negative for chest pain and palpitations.  Gastrointestinal: Negative for abdominal pain and nausea.  Genitourinary: Negative for dysuria and hematuria.  Musculoskeletal: Negative for joint pain and myalgias.  Skin: Negative for rash.  Neurological: Negative for tingling and headaches.  Psychiatric/Behavioral: Negative for depression. The patient is not nervous/anxious.     Objective:  BP 136/82 (BP Location: Left Arm, Patient Position: Sitting, Cuff Size: Large)   Pulse 67   Temp (!) 97.4 F (36.3 C) (Oral)   Wt 257 lb 9.6 oz (116.8 kg)   SpO2 97%   BMI 42.92 kg/m   BP/Weight 03/01/2018 02/15/2018 09/01/2016  Systolic BP 136 127 109  Diastolic BP 82 84 69  Wt. (Lbs) 257.6 257 240  BMI 42.92 42.82 41.2       Physical Exam  Constitutional: He is oriented to person, place, and time.  Well developed, obese, NAD, polite  HENT:  Head: Normocephalic and atraumatic.  Eyes: Conjunctivae and EOM are normal. No scleral icterus.  Neck: Normal range of motion. Neck supple. No thyromegaly present.  Cardiovascular: Normal rate.  Pulmonary/Chest: Effort normal.  Neurological: He is alert and oriented to person, place, and time. No cranial nerve deficit. Coordination normal.  Skin: Skin is warm and dry. No rash noted. No erythema. No pallor.  Psychiatric: He has a normal mood and affect. His behavior is normal. Thought content normal.  Vitals reviewed.    Assessment & Plan:   1. Acute intractable headache, unspecified headache type - Suspect OSA related. Needs to fill out [facility] Application and [employer] application.  - Can use Ibuprofen OTC PRN until sleep study is conducted.  2. Daytime somnolence - Suspect OSA related. Needs to fill out [facility] Application and [employer] application    3. Class 3 severe obesity with serious comorbidity and body mass index (BMI) of 40.0 to 44.9 in adult, unspecified obesity type (HCC) - Likely contributing to suspected OSA.   4. Elevated liver enzymes - Hepatic Function Panel - Hepatitis panel, acute - Will await completion of financial assistance paperwork before I refer to [facility]. Patient unable to pay out of pocket at this time.   5. Need for Tdap vaccination - Tdap vaccine greater than or equal to 7yo IM  6. Need for  prophylactic vaccination and inoculation against influenza - Flu Vaccine QUAD 6+ mos PF IM (Fluarix Quad PF)     Follow-up: Return in about 8 weeks (around 04/26/2018) for f/u liver enzymes and RUQ US.   Loletta Specteroger David Gomez PA

## 2018-03-02 LAB — HEPATITIS PANEL, ACUTE
HEP A IGM: NEGATIVE
HEP B C IGM: NEGATIVE
Hep C Virus Ab: <0.1 s/co ratio (ref 0.0–0.9)
Hepatitis B Surface Ag: NEGATIVE

## 2018-03-02 LAB — HEPATIC FUNCTION PANEL
ALT: 48 IU/L — AB (ref 0–44)
AST: 31 IU/L (ref 0–40)
Albumin: 4.8 g/dL (ref 3.5–5.5)
Alkaline Phosphatase: 73 IU/L (ref 39–117)
BILIRUBIN TOTAL: 0.5 mg/dL (ref 0.0–1.2)
Bilirubin, Direct: 0.13 mg/dL (ref 0.00–0.40)
Total Protein: 8.3 g/dL (ref 6.0–8.5)

## 2018-03-02 LAB — HIV ANTIBODY (ROUTINE TESTING W REFLEX): HIV Screen 4th Generation wRfx: NONREACTIVE

## 2018-03-05 ENCOUNTER — Telehealth (INDEPENDENT_AMBULATORY_CARE_PROVIDER_SITE_OTHER): Payer: Self-pay

## 2018-03-05 NOTE — Telephone Encounter (Signed)
Patient aware of negative HIV and Hepatitis, also aware of slight liver inflammation possibly due to fatty food intake. Tony Shelton, CMA

## 2018-03-05 NOTE — Telephone Encounter (Signed)
-----   Message from Loletta Specteroger David Gomez, PA-C sent at 03/03/2018  1:48 PM EST ----- Negative HIV and Hepatitis. Slight liver inflammation may be due to fatty food intake.

## 2018-03-14 ENCOUNTER — Other Ambulatory Visit (INDEPENDENT_AMBULATORY_CARE_PROVIDER_SITE_OTHER): Payer: Self-pay | Admitting: Physician Assistant

## 2018-03-14 DIAGNOSIS — R5383 Other fatigue: Secondary | ICD-10-CM

## 2018-03-15 NOTE — Telephone Encounter (Signed)
FWD to PCP. Samanta Gal S [place], CMA  

## 2018-09-17 ENCOUNTER — Emergency Department: Payer: No Typology Code available for payment source

## 2018-09-17 ENCOUNTER — Emergency Department
Admission: EM | Admit: 2018-09-17 | Discharge: 2018-09-17 | Disposition: A | Discharge status: Home / Self Care | Payer: No Typology Code available for payment source | Attending: Student in an Organized Health Care Education/Training Program | Admitting: Student in an Organized Health Care Education/Training Program

## 2018-09-17 ENCOUNTER — Encounter: Payer: Self-pay | Admitting: Emergency Medicine

## 2018-09-17 DIAGNOSIS — W2210XA Striking against or struck by unspecified automobile airbag, initial encounter: Secondary | ICD-10-CM | POA: Insufficient documentation

## 2018-09-17 DIAGNOSIS — S161XXA Strain of muscle, fascia and tendon at neck level, initial encounter: Secondary | ICD-10-CM | POA: Diagnosis not present

## 2018-09-17 DIAGNOSIS — M549 Dorsalgia, unspecified: Secondary | ICD-10-CM | POA: Insufficient documentation

## 2018-09-17 DIAGNOSIS — Y92410 Unspecified street and highway as the place of occurrence of the external cause: Secondary | ICD-10-CM | POA: Insufficient documentation

## 2018-09-17 DIAGNOSIS — Y9389 Activity, other specified: Secondary | ICD-10-CM | POA: Insufficient documentation

## 2018-09-17 DIAGNOSIS — S199XXA Unspecified injury of neck, initial encounter: Secondary | ICD-10-CM | POA: Diagnosis present

## 2018-09-17 DIAGNOSIS — R0789 Other chest pain: Secondary | ICD-10-CM | POA: Diagnosis not present

## 2018-09-17 DIAGNOSIS — Z87891 Personal history of nicotine dependence: Secondary | ICD-10-CM | POA: Diagnosis not present

## 2018-09-17 DIAGNOSIS — Y998 Other external cause status: Secondary | ICD-10-CM | POA: Diagnosis not present

## 2018-09-17 MED ORDER — CYCLOBENZAPRINE HCL 10 MG PO TABS: 10.0000 mg | ORAL_TABLET | Freq: Three times a day (TID) | ORAL | 0 refills | Status: DC | PRN

## 2018-09-17 MED ORDER — NAPROXEN 500 MG PO TABS: 500.0000 mg | ORAL_TABLET | Freq: Two times a day (BID) | ORAL | 0 refills | Status: DC
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
## 2018-09-17 NOTE — ED Provider Notes (Signed)
ED ECG REPORT I, Willy EddyPatrick Neithan Day, the attending physician, personally viewed and interpreted this ECG.   Date: 09/17/2018  EKG Time: 8:51  Rate: 60  Rhythm: normal EKG, normal sinus rhythm, unchanged from previous tracings  Axis: normal  Intervals:normal  ST&T Change: no stemi    Willy Eddyobinson, Anjelo Pullman, MD 09/17/18 1108
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
## 2018-09-17 NOTE — Discharge Instructions (Signed)
Follow up with primary care if not improving over the week. ? ?Return to the ER for symptoms that change or worsen if unable to schedule an appointment. ?
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
## 2019-09-20 ENCOUNTER — Ambulatory Visit (INDEPENDENT_AMBULATORY_CARE_PROVIDER_SITE_OTHER): Payer: Self-pay | Admitting: Primary Care

## 2019-09-20 ENCOUNTER — Encounter (INDEPENDENT_AMBULATORY_CARE_PROVIDER_SITE_OTHER): Payer: Self-pay | Admitting: Primary Care

## 2019-09-20 ENCOUNTER — Other Ambulatory Visit: Payer: Self-pay

## 2019-09-20 VITALS — BP 120/76 | HR 69 | Temp 97.8°F | Ht 64.0 in | Wt 234.6 lb

## 2019-09-20 DIAGNOSIS — E66813 Obesity, class 3: Secondary | ICD-10-CM

## 2019-09-20 DIAGNOSIS — R4586 Emotional lability: Secondary | ICD-10-CM

## 2019-09-20 DIAGNOSIS — Z131 Encounter for screening for diabetes mellitus: Secondary | ICD-10-CM

## 2019-09-20 DIAGNOSIS — F0630 Mood disorder due to known physiological condition, unspecified: Secondary | ICD-10-CM

## 2019-09-20 DIAGNOSIS — Z6841 Body Mass Index (BMI) 40.0 and over, adult: Secondary | ICD-10-CM

## 2019-09-20 LAB — POCT GLYCOSYLATED HEMOGLOBIN (HGB A1C): Hemoglobin A1C: 5.2 % (ref 4.0–5.6)

## 2019-09-20 MED ORDER — ESCITALOPRAM OXALATE 10 MG PO TABS: 10.0000 mg | ORAL_TABLET | Freq: Every day | ORAL | 1 refills | Status: DC

## 2019-09-20 NOTE — Progress Notes (Signed)
Concerns with mood changes

## 2019-09-20 NOTE — Patient Instructions (Signed)
Major Depressive Disorder, Adult Major depressive disorder (MDD) is a mental health condition. It may also be called clinical depression or unipolar depression. MDD usually causes feelings of sadness, hopelessness, or helplessness. MDD can also cause physical symptoms. It can interfere with work, school, relationships, and other everyday activities. MDD may be mild, moderate, or severe. It may occur once (single episode major depressive disorder) or it may occur multiple times (recurrent major depressive disorder). What are the causes? The exact cause of this condition is not known. MDD is most likely caused by a combination of things, which may include:  Genetic factors. These are traits that are passed along from parent to child.  Individual factors. Your personality, your behavior, and the way you handle your thoughts and feelings may contribute to MDD. This includes personality traits and behaviors learned from others.  Physical factors, such as: ? Differences in the part of your brain that controls emotion. This part of your brain may be different than it is in people who do not have MDD. ? Long-term (chronic) medical or psychiatric illnesses.  Social factors. Traumatic experiences or major life changes may play a role in the development of MDD. What increases the risk? This condition is more likely to develop in women. The following factors may also make you more likely to develop MDD:  A family history of depression.  Troubled family relationships.  Abnormally low levels of certain brain chemicals.  Traumatic events in childhood, especially abuse or the loss of a parent.  Being under a lot of stress, or long-term stress, especially from upsetting life experiences or losses.  A history of: ? Chronic physical illness. ? Other mental health disorders. ? Substance abuse.  Poor living conditions.  Experiencing social exclusion or discrimination on a regular basis. What are the  signs or symptoms? The main symptoms of MDD typically include:  Constant depressed or irritable mood.  Loss of interest in things and activities. MDD symptoms may also include:  Sleeping or eating too much or too little.  Unexplained weight change.  Fatigue or low energy.  Feelings of worthlessness or guilt.  Difficulty thinking clearly or making decisions.  Thoughts of suicide or of harming others.  Physical agitation or weakness.  Isolation. Severe cases of MDD may also occur with other symptoms, such as:  Delusions or hallucinations, in which you imagine things that are not real (psychotic depression).  Low-level depression that lasts at least a year (chronic depression or persistent depressive disorder).  Extreme sadness and hopelessness (melancholic depression).  Trouble speaking and moving (catatonic depression). How is this diagnosed? This condition may be diagnosed based on:  Your symptoms.  Your medical history, including your mental health history. This may involve tests to evaluate your mental health. You may be asked questions about your lifestyle, including any drug and alcohol use, and how long you have had symptoms of MDD.  A physical exam.  Blood tests to rule out other conditions. You must have a depressed mood and at least four other MDD symptoms most of the day, nearly every day in the same 2-week timeframe before your health care provider can confirm a diagnosis of MDD. How is this treated? This condition is usually treated by mental health professionals, such as psychologists, psychiatrists, and clinical social workers. You may need more than one type of treatment. Treatment may include:  Psychotherapy. This is also called talk therapy or counseling. Types of psychotherapy include: ? Cognitive behavioral therapy (CBT). This type of therapy   teaches you to recognize unhealthy feelings, thoughts, and behaviors, and replace them with positive thoughts  and actions. ? Interpersonal therapy (IPT). This helps you to improve the way you relate to and communicate with others. ? Family therapy. This treatment includes members of your family.  Medicine to treat anxiety and depression, or to help you control certain emotions and behaviors.  Lifestyle changes, such as: ? Limiting alcohol and drug use. ? Exercising regularly. ? Getting plenty of sleep. ? Making healthy eating choices. ? Spending more time outdoors.  Treatments involving stimulation of the brain can be used in situations with extremely severe symptoms, or when medicine or other therapies do not work over time. These treatments include electroconvulsive therapy, transcranial magnetic stimulation, and vagal nerve stimulation. Follow these instructions at home: Activity  Return to your normal activities as told by your health care provider.  Exercise regularly and spend time outdoors as told by your health care provider. General instructions  Take over-the-counter and prescription medicines only as told by your health care provider.  Do not drink alcohol. If you drink alcohol, limit your alcohol intake to no more than 1 drink a day for nonpregnant women and 2 drinks a day for men. One drink equals 12 oz of beer, 5 oz of wine, or 1 oz of hard liquor. Alcohol can affect any antidepressant medicines you are taking. Talk to your health care provider about your alcohol use.  Eat a healthy diet and get plenty of sleep.  Find activities that you enjoy doing, and make time to do them.  Consider joining a support group. Your health care provider may be able to recommend a support group.  Keep all follow-up visits as told by your health care provider. This is important. Where to find more information National Alliance on Mental Illness  www.nami.org [place] National Institute of Mental Health  www.nimh.nih.gov National Suicide Prevention Lifeline  1-800-273-TALK (8255). This is  free, 24-hour help. Contact a health care provider if:  Your symptoms get worse.  You develop new symptoms. Get help right away if:  You self-harm.  You have serious thoughts about hurting yourself or others.  You see, hear, taste, smell, or feel things that are not present (hallucinate). This information is not intended to replace advice given to you by your health care provider. Make sure you discuss any questions you have with your health care provider. Document Released: 04/11/2013 Document Revised: 11/27/2017 Document Reviewed: 06/25/2016 Elsevier Patient Education  2020 Elsevier Inc.  

## 2019-09-20 NOTE — Progress Notes (Signed)
Established Patient Office Visit  Subjective:  Patient ID: Tony Shelton, male    DOB: 08-19-1986  Age: 33 y.o. MRN: [id number]  CC:  Chief Complaint  Patient presents with  . New Patient (Initial Visit)    HPI Tony Shelton presents for establishment with a new provider but has been a patient at [facility]. She is in today concerns with her mood changing.  History reviewed. No pertinent past medical history.  History reviewed. No pertinent surgical history.  Family History  Problem Relation Age of Onset  . Diabetes Mother   . Diabetes Maternal Grandmother     Social History   Socioeconomic History  . Marital status: Single    Spouse name: Not on file  . Number of children: Not on file  . Years of education: Not on file  . Highest education level: Not on file  Occupational History  . Not on file  Social Needs  . Financial resource strain: Not on file  . Food insecurity    Worry: Not on file    Inability: Not on file  . Transportation needs    Medical: Not on file    Non-medical: Not on file  Tobacco Use  . Smoking status: Former Games developer  . Smokeless tobacco: Never Used  Substance and Sexual Activity  . Alcohol use: Yes  . Drug use: No  . Sexual activity: Not on file  Lifestyle  . Physical activity    Days per week: Not on file    Minutes per session: Not on file  . Stress: Not on file  Relationships  . Social Musician on phone: Not on file    Gets together: Not on file    Attends religious service: Not on file    Active member of club or organization: Not on file    Attends meetings of clubs or organizations: Not on file    Relationship status: Not on file  . Intimate partner violence    Fear of current or ex partner: Not on file    Emotionally abused: Not on file    Physically abused: Not on file    Forced sexual activity: Not on file  Other Topics Concern  . Not on file  Social History Narrative  . Not on file     Outpatient Medications Prior to Visit  Medication Sig Dispense Refill  . Cholecalciferol (VITAMIN D-3) 5000 units TABS Take 1 tablet by mouth daily. 30 tablet 0  . cyclobenzaprine (FLEXERIL) 10 MG tablet Take 1 tablet (10 mg total) by mouth 3 (three) times daily as needed for muscle spasms. 30 tablet 0  . naproxen (NAPROSYN) 500 MG tablet Take 1 tablet (500 mg total) by mouth 2 (two) times daily with a meal. 30 tablet 0   No facility-administered medications prior to visit.     No Known Allergies  ROS Review of Systems  Allergic/Immunologic: Positive for environmental allergies.  Neurological: Positive for headaches.  Psychiatric/Behavioral: Positive for behavioral problems.       Depressed, short temper, angry       Objective:    Physical Exam  Constitutional: He is oriented to person, place, and time. He appears well-developed and well-nourished.  Morbid obesity  HENT:  Head: Normocephalic.  Eyes: Pupils are equal, round, and reactive to light. EOM are normal.  Neck: Normal range of motion. Neck supple.  Cardiovascular: Normal rate and regular rhythm.  Pulmonary/Chest: Effort normal and breath sounds normal.  Abdominal:  Soft. Bowel sounds are normal. He exhibits distension.  Musculoskeletal: Normal range of motion.  Neurological: He is alert and oriented to person, place, and time.  Skin: Skin is warm.  Psychiatric: He has a normal mood and affect.    BP 120/76 (BP Location: Left Arm, Patient Position: Sitting, Cuff Size: Normal)   Pulse 69   Temp 97.8 F (36.6 C) (Tympanic)   Ht 5\' 4"  (1.626 m)   Wt 234 lb 9.6 oz (106.4 kg)   SpO2 92%   BMI 40.27 kg/m  Wt Readings from Last 3 Encounters:  09/20/19 234 lb 9.6 oz (106.4 kg)  09/17/18 255 lb (115.7 kg)  03/01/18 257 lb 9.6 oz (116.8 kg)     There are no preventive care reminders to display for this patient.  There are no preventive care reminders to display for this patient.  Lab Results  Component  Value Date   TSH 1.380 02/15/2018   Lab Results  Component Value Date   WBC 7.3 02/15/2018   HGB 15.2 02/15/2018   HCT 43.8 02/15/2018   MCV 89 02/15/2018   PLT 278 02/15/2018   Lab Results  Component Value Date   NA 141 02/15/2018   K 4.6 02/15/2018   CO2 23 02/15/2018   GLUCOSE 86 02/15/2018   BUN 8 02/15/2018   CREATININE 0.90 02/15/2018   BILITOT 0.5 03/01/2018   ALKPHOS 73 03/01/2018   AST 31 03/01/2018   ALT 48 (H) 03/01/2018   PROT 8.3 03/01/2018   ALBUMIN 4.8 03/01/2018   CALCIUM 9.3 02/15/2018   ANIONGAP 6 09/01/2016   Lab Results  Component Value Date   CHOL 159 02/15/2018   Lab Results  Component Value Date   HDL 45 02/15/2018   Lab Results  Component Value Date   LDLCALC 94 02/15/2018   Lab Results  Component Value Date   TRIG 99 02/15/2018   Lab Results  Component Value Date   CHOLHDL 3.5 02/15/2018   Lab Results  Component Value Date   HGBA1C 5.2 09/20/2019      Assessment & Plan:  Tony Shelton was seen today for new patient (initial visit).  Diagnoses and all orders for this visit:  Screening for diabetes mellitus -     HgB A1c 5.2   Class 3 severe obesity with serious comorbidity and body mass index (BMI) of 40.0 to 44.9 in adult, unspecified obesity type (HCC) Morbid Obesity is greater than 40 BMI indicating an excess in caloric intake or underlining conditions. This may lead to other co-morbidities. Lifestyle modifications of diet and exercise may reduce obesity. Patient is exercising and monitoring his intake and has lost 20 lbs.  Mood disorder in conditions classified elsewhere Depression is when you feel down, blue or sad for at least 2 weeks in a row. You may experience increaset increase in sleeping, eating or  loss of interest in doing things that once gave you pleasure. Feeling worthless, guilty, nervous and low self esteem, avoiding interaction with other people or increase agitation. Prescribe Lexapro 10mg  at bedtime follow up  in 4 weeks with CSW and evaluate the effectiveness of Lexapro     No orders of the defined types were placed in this encounter.   Follow-up: Return in about 4 weeks (around 10/18/2019) for schedule with CSW and follow up on affect of medication.    Kerin Perna, NP

## 2019-10-18 ENCOUNTER — Ambulatory Visit (INDEPENDENT_AMBULATORY_CARE_PROVIDER_SITE_OTHER): Payer: Self-pay | Admitting: Licensed Clinical Social Worker

## 2019-10-18 ENCOUNTER — Other Ambulatory Visit: Payer: Self-pay

## 2019-10-18 ENCOUNTER — Ambulatory Visit (INDEPENDENT_AMBULATORY_CARE_PROVIDER_SITE_OTHER): Payer: Self-pay | Admitting: Primary Care

## 2019-10-18 ENCOUNTER — Encounter (INDEPENDENT_AMBULATORY_CARE_PROVIDER_SITE_OTHER): Payer: Self-pay | Admitting: Primary Care

## 2019-10-18 VITALS — BP 112/72 | HR 74 | Temp 97.1°F | Ht 64.0 in | Wt 234.2 lb

## 2019-10-18 DIAGNOSIS — F0630 Mood disorder due to known physiological condition, unspecified: Secondary | ICD-10-CM

## 2019-10-18 DIAGNOSIS — R5383 Other fatigue: Secondary | ICD-10-CM

## 2019-10-18 DIAGNOSIS — Z6841 Body Mass Index (BMI) 40.0 and over, adult: Secondary | ICD-10-CM

## 2019-10-18 DIAGNOSIS — F411 Generalized anxiety disorder: Secondary | ICD-10-CM

## 2019-10-18 DIAGNOSIS — G4700 Insomnia, unspecified: Secondary | ICD-10-CM

## 2019-10-18 MED ORDER — ESCITALOPRAM OXALATE 10 MG PO TABS: 10.0000 mg | ORAL_TABLET | Freq: Every day | ORAL | 3 refills | Status: DC

## 2019-10-18 NOTE — Progress Notes (Signed)
Established Patient Office Visit  Subjective:  Patient ID: Tony Shelton, male    DOB: [birth year]  Age: 33 y.o. MRN: [id number]  CC:  Chief Complaint  Patient presents with  . Follow-up    affect on medication    HPI Tony Shelton presents for effectiveness of Lexapro. He feels he feels a difference and a friend wrote a note to say the same expect he is more tired and drowsy but having difficult sleeping. He is taking lexapro in the morning.   History reviewed. No pertinent past medical history.  History reviewed. No pertinent surgical history.  Family History  Problem Relation Age of Onset  . Diabetes Mother   . Diabetes Maternal Grandmother     Social History   Socioeconomic History  . Marital status: Single    Spouse name: Not on file  . Number of children: Not on file  . Years of education: Not on file  . Highest education level: Not on file  Occupational History  . Not on file  Social Needs  . Financial resource strain: Not on file  . Food insecurity    Worry: Not on file    Inability: Not on file  . Transportation needs    Medical: Not on file    Non-medical: Not on file  Tobacco Use  . Smoking status: Former Research scientist (life sciences)  . Smokeless tobacco: Never Used  Substance and Sexual Activity  . Alcohol use: Yes  . Drug use: No  . Sexual activity: Not on file  Lifestyle  . Physical activity    Days per week: Not on file    Minutes per session: Not on file  . Stress: Not on file  Relationships  . Social Herbalist on phone: Not on file    Gets together: Not on file    Attends religious service: Not on file    Active member of club or organization: Not on file    Attends meetings of clubs or organizations: Not on file    Relationship status: Not on file  . Intimate partner violence    Fear of current or ex partner: Not on file    Emotionally abused: Not on file    Physically abused: Not on file    Forced sexual activity:  Not on file  Other Topics Concern  . Not on file  Social History Narrative  . Not on file    Outpatient Medications Prior to Visit  Medication Sig Dispense Refill  . escitalopram (LEXAPRO) 10 MG tablet Take 1 tablet (10 mg total) by mouth daily. 30 tablet 1   No facility-administered medications prior to visit.     No Known Allergies  ROS Review of Systems  Constitutional: Positive for fatigue.  Psychiatric/Behavioral: Positive for sleep disturbance.  All other systems reviewed and are negative.     Objective:    Physical Exam  Constitutional: He is oriented to person, place, and time. He appears well-developed and well-nourished.  HENT:  Head: Normocephalic.  Neck: Neck supple.  Cardiovascular: Normal rate and regular rhythm.  Pulmonary/Chest: Effort normal and breath sounds normal.  Abdominal: Soft. Bowel sounds are normal. He exhibits distension.  Musculoskeletal: Normal range of motion.  Neurological: He is oriented to person, place, and time.  Psychiatric: He has a normal mood and affect.    BP 112/72 (BP Location: Left Arm, Patient Position: Sitting, Cuff Size: Normal)   Pulse 74   Temp (!) 97.1 F (36.2 C) (  Temporal)   Ht 5\' 4"  (1.626 m)   Wt 234 lb 3.2 oz (106.2 kg)   SpO2 97%   BMI 40.20 kg/m  Wt Readings from Last 3 Encounters:  10/18/19 234 lb 3.2 oz (106.2 kg)  09/20/19 234 lb 9.6 oz (106.4 kg)  09/17/18 255 lb (115.7 kg)     There are no preventive care reminders to display for this patient.  There are no preventive care reminders to display for this patient.  Lab Results  Component Value Date   TSH 1.380 02/15/2018   Lab Results  Component Value Date   WBC 7.3 02/15/2018   HGB 15.2 02/15/2018   HCT 43.8 02/15/2018   MCV 89 02/15/2018   PLT 278 02/15/2018   Lab Results  Component Value Date   NA 141 02/15/2018   K 4.6 02/15/2018   CO2 23 02/15/2018   GLUCOSE 86 02/15/2018   BUN 8 02/15/2018   CREATININE 0.90 02/15/2018    BILITOT 0.5 03/01/2018   ALKPHOS 73 03/01/2018   AST 31 03/01/2018   ALT 48 (H) 03/01/2018   PROT 8.3 03/01/2018   ALBUMIN 4.8 03/01/2018   CALCIUM 9.3 02/15/2018   ANIONGAP 6 09/01/2016   Lab Results  Component Value Date   CHOL 159 02/15/2018   Lab Results  Component Value Date   HDL 45 02/15/2018   Lab Results  Component Value Date   LDLCALC 94 02/15/2018   Lab Results  Component Value Date   TRIG 99 02/15/2018   Lab Results  Component Value Date   CHOLHDL 3.5 02/15/2018   Lab Results  Component Value Date   HGBA1C 5.2 09/20/2019      Assessment & Plan:  Arshawn was seen today for follow-up.  Diagnoses and all orders for this visit:  Mood disorder in conditions classified elsewhere Lexapro is helping his patients and anxiety episodes have decreased. He is taking medication in the morning which can contribute to drowsiness and fatigue. Recommended to take medication at bedtime which will also help with insomnia.  Insomnia, unspecified type Lexapro at bedtime should continue to relax him and stabilize mood and help with insomnia will start taking at bedtime  Fatigue, unspecified type He states he has been working more hours for work but was having interrupted sleep. Probably contributing to fatigue. Changing Lexapro to bedtime should help rest and cause sleep.  Morbid obesity (HCC) Patient is activity exercising daily and has changed his diet to include baked and grilled meat with increasing vegetables. Decrease breads/buns only eating patty. He also has reduced alcohol intake  Meds ordered this encounter  Medications  . escitalopram (LEXAPRO) 10 MG tablet    Sig: Take 1 tablet (10 mg total) by mouth daily.    Dispense:  30 tablet    Refill:  3    Follow-up: Return in about 6 months (around 04/17/2020) for Follow up on medication and effective and schedule visit with CSW as needed.    04/19/2020, NP

## 2019-10-18 NOTE — Progress Notes (Signed)
Pt complains that for the last two weeks he hasnt been getting sleep. Wonders if its the medication, can he switch to taking it at night?

## 2019-10-18 NOTE — Patient Instructions (Signed)

## 2019-10-21 NOTE — BH Specialist Note (Signed)
Integrated Behavioral Health Initial Visit  MRN: [id number] Name: Tony Shelton [facility]  Number of [place] Clinician visits:: 1/6 Session Start time: 9:00 AM  Session End time: 9:15 AM Total time: 15  Type of Service: [place] Interpretor:No. Interpretor Name and Language: NA   Warm Hand Off Completed.       SUBJECTIVE: [name] is a 33 y.o. male accompanied by self Patient was referred by FNP Oletta Lamas for anxiety and depression. Patient reports the following symptoms/concerns: Pt reports a decrease in anxiety and depression since taking Lexapro, "it's going great"  Reports feeling tired during the day Duration of problem: 1 month; Severity of problem: moderate  OBJECTIVE: Mood: Pleasant and Affect: Appropriate Risk of harm to self or others: No plan to harm self or others  LIFE CONTEXT: Family and Social: Pt receives strong support from family and friends School/Work: Pt is employed. He is currently uninsured Self-Care: Pt participates in medication management through PCP and has significantly decreased alcohol use Life Changes: Pt reports decrease in depression and anxiety symptoms since the start of taking Lexapro one month ago. Pt complains of feelings tired during the day  GOALS ADDRESSED: Patient will: 1. Reduce symptoms of: anxiety and depression 2. Increase knowledge and/or ability of: coping skills and healthy habits  3. Demonstrate ability to: Increase healthy adjustment to current life circumstances  INTERVENTIONS: Interventions utilized: Solution-Focused Strategies and Psychoeducation and/or Health Education  Standardized Assessments completed: GAD-7 and PHQ 2&9  ASSESSMENT: Patient currently experiencing a decrease in depression and anxiety symptoms since the onset of medication management. He complains of feeling tired throughout the day. Denies SI/HI.   Patient may benefit from  continuing medication regimen. LCSW commended pt on compliance of medications, in addition, decreasing substance use. Healthy coping skills were discussed.   PLAN: 1. Follow up with behavioral health clinician on : Schedule follow up appt to address any behavioral health and/or resource needs 2. Behavioral recommendations: Comply with medication management and utilize healthy coping skills 3. Referral(s): [facility] (In Clinic) 4. "From scale of 1-10, how likely are you to follow plan?":   Rebekah Chesterfield, LCSW 10/21/2019 2:24 PM

## 2020-03-10 ENCOUNTER — Other Ambulatory Visit (INDEPENDENT_AMBULATORY_CARE_PROVIDER_SITE_OTHER): Payer: Self-pay | Admitting: Primary Care

## 2020-03-10 DIAGNOSIS — F0630 Mood disorder due to known physiological condition, unspecified: Secondary | ICD-10-CM

## 2020-03-12 NOTE — Telephone Encounter (Signed)
Sent to PCP ?

## 2020-04-17 ENCOUNTER — Telehealth (INDEPENDENT_AMBULATORY_CARE_PROVIDER_SITE_OTHER): Payer: Self-pay | Admitting: Primary Care

## 2020-04-17 ENCOUNTER — Encounter (INDEPENDENT_AMBULATORY_CARE_PROVIDER_SITE_OTHER): Payer: Self-pay | Admitting: Primary Care

## 2020-04-17 DIAGNOSIS — F0630 Mood disorder due to known physiological condition, unspecified: Secondary | ICD-10-CM

## 2020-04-17 MED ORDER — ESCITALOPRAM OXALATE 20 MG PO TABS: 20.0000 mg | ORAL_TABLET | Freq: Every day | ORAL | 1 refills | Status: DC

## 2020-04-17 NOTE — Progress Notes (Signed)
Virtual Visit via Telephone Note  I connected with Tony Shelton on 04/17/20 at  8:30 AM EDT by telephone and verified that I am speaking with the correct person using two identifiers.   I discussed the limitations, risks, security and privacy concerns of performing an evaluation and management service by telephone and the availability of in person appointments. I also discussed with the patient that there may be a patient responsible charge related to this service. The patient expressed understanding and agreed to proceed.   History of Present Illness: [name] is a 34 year old male having a virtual visit today for concerns his depression medication may need to be adjusted.  Patient admits to  Noticing,  mood swings, increase agitation , angry slamming the door , and down . History reviewed.  past medical history- Depression  Observations/Objective: Review of Systems  Psychiatric/Behavioral: Positive for depression. The patient is nervous/anxious.   All other systems reviewed and are negative.  Assessment and Plan: Diagnoses and all orders for this visit:  Mood disorder in conditions classified elsewhere He has expressed changes in mood and feeling lexapro needs to be increase. He does not his old behaviors returning  nervous and  increase agitation. Increased lexapro from 10mg  to 20mg  and re-evaluate in 6 weeks.  Other orders -     escitalopram (LEXAPRO) 20 MG tablet; Take 1 tablet (20 mg total) by mouth daily.    Follow Up Instructions:    I discussed the assessment and treatment plan with the patient. The patient was provided an opportunity to ask questions and all were answered. The patient agreed with the plan and demonstrated an understanding of the instructions.   The patient was advised to call back or seek an in-person evaluation if the symptoms worsen or if the condition fails to improve as anticipated.  I provided 10 minutes of  non-face-to-face time during this encounter.   , NP
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
## 2020-06-18 ENCOUNTER — Telehealth (INDEPENDENT_AMBULATORY_CARE_PROVIDER_SITE_OTHER): Payer: Self-pay | Admitting: Primary Care

## 2020-06-27 ENCOUNTER — Encounter (INDEPENDENT_AMBULATORY_CARE_PROVIDER_SITE_OTHER): Payer: Self-pay | Admitting: Primary Care

## 2020-06-27 ENCOUNTER — Telehealth (INDEPENDENT_AMBULATORY_CARE_PROVIDER_SITE_OTHER): Payer: Self-pay | Admitting: Primary Care

## 2020-06-27 DIAGNOSIS — F320 Major depressive disorder, single episode, mild: Secondary | ICD-10-CM

## 2020-06-27 DIAGNOSIS — F32A Depression, unspecified: Secondary | ICD-10-CM | POA: Insufficient documentation

## 2020-06-27 NOTE — Progress Notes (Signed)
Virtual Visit via Telephone Note  I connected with Tony Shelton on 06/27/20 at 11:10 AM EDT by telephone and verified that I am speaking with the correct person using two identifiers.   I discussed the limitations, risks, security and privacy concerns of performing an evaluation and management service by telephone and the availability of in person appointments. I also discussed with the patient that there may be a patient responsible charge related to this service. The patient expressed understanding and agreed to proceed.   History of Present Illness: [name] is having a tele visit for effectiveness of increase dose of lexapro from 10mg  to 20mg . Patient I am please to report he states it is phenomenal . He voice no concern. Medication  Lexapro 20mg  at bedtime  Past Medical History  Morbid obesity  Depression  Observations/Objective: Review of Systems  Psychiatric/Behavioral: Positive for depression.  All other systems reviewed and are negative.  Assessment and Plan: Diagnoses and all orders for this visit:  Current mild episode of major depressive disorder, unspecified whether recurrent [facility]) Patient feels in control, of emotions , situation can sleep. Increase dose has shown to have favorable outcome   escitalopram (LEXAPRO) 20 MG tablet; Take 1 tablet (20 mg total) by mouth daily.    Follow Up Instructions:    I discussed the assessment and treatment plan with the patient. The patient was provided an opportunity to ask questions and all were answered. The patient agreed with the plan and demonstrated an understanding of the instructions.   The patient was advised to call back or seek an in-person evaluation if the symptoms worsen or if the condition fails to improve as anticipated.  I provided 8 minutes of non-face-to-face time during this encounter.   , NP

## 2020-07-02 MED ORDER — ESCITALOPRAM OXALATE 20 MG PO TABS: 20.0000 mg | ORAL_TABLET | Freq: Every day | ORAL | 1 refills | Status: DC

## 2020-09-26 ENCOUNTER — Ambulatory Visit (INDEPENDENT_AMBULATORY_CARE_PROVIDER_SITE_OTHER): Payer: Self-pay | Admitting: Primary Care

## 2020-09-27 ENCOUNTER — Ambulatory Visit (INDEPENDENT_AMBULATORY_CARE_PROVIDER_SITE_OTHER): Payer: Self-pay | Admitting: Primary Care

## 2021-06-01 ENCOUNTER — Other Ambulatory Visit (INDEPENDENT_AMBULATORY_CARE_PROVIDER_SITE_OTHER): Payer: Self-pay | Admitting: Primary Care

## 2021-06-01 DIAGNOSIS — F320 Major depressive disorder, single episode, mild: Secondary | ICD-10-CM

## 2021-09-10 ENCOUNTER — Other Ambulatory Visit (INDEPENDENT_AMBULATORY_CARE_PROVIDER_SITE_OTHER): Payer: Self-pay | Admitting: Primary Care

## 2021-09-10 DIAGNOSIS — F320 Major depressive disorder, single episode, mild: Secondary | ICD-10-CM

## 2021-09-10 MED ORDER — ESCITALOPRAM OXALATE 20 MG PO TABS: 20.0000 mg | ORAL_TABLET | Freq: Every day | ORAL | 0 refills | Status: DC

## 2021-09-10 NOTE — Telephone Encounter (Signed)
Pt has appt 10/01/21. Courtesy refill of #21 until appt date.

## 2021-09-10 NOTE — Telephone Encounter (Signed)
Using [employer] [phone]. Patient called and the patient says he doesn't need an interpreter, so Kandis Mannan placed himself on mute just in case of questions. Patient advised he will need an appointment before refills could be given, last OV 05/2020, he verbalized understanding and agrees to the appointment, scheduled for Tuesday 10/01/21 at 0950 with Gwinda Passe, NP for CPE, advised NPO x water in case labs will need to be done, patient verbalized understanding. He asks for enough medication to last until his appointment, advised he will receive enough to last.

## 2021-09-10 NOTE — Telephone Encounter (Signed)
Medication Refill - Medication: escitalopram (LEXAPRO) 20 MG tablet   Pt has been out for two months  Has the patient contacted their pharmacy? Yes.   (Agent: If no, request that the patient contact the pharmacy for the refill.) (Agent: If yes, when and what did the pharmacy advise?)  Preferred Pharmacy (with phone number or street name):  [id number] [place], [place] - [address]  [address] [place] [address]  Phone: [phone] Fax: [phone]    Agent: Please be advised that RX refills may take up to 3 business days. We ask that you follow-up with your pharmacy.

## 2021-09-27 ENCOUNTER — Ambulatory Visit (INDEPENDENT_AMBULATORY_CARE_PROVIDER_SITE_OTHER): Payer: Self-pay | Admitting: Primary Care

## 2021-09-27 ENCOUNTER — Encounter (INDEPENDENT_AMBULATORY_CARE_PROVIDER_SITE_OTHER): Payer: Self-pay | Admitting: Primary Care

## 2021-09-27 ENCOUNTER — Other Ambulatory Visit: Payer: Self-pay

## 2021-09-27 VITALS — BP 108/73 | HR 71 | Temp 92.1°F | Ht 64.0 in | Wt 258.0 lb

## 2021-09-27 DIAGNOSIS — Z6841 Body Mass Index (BMI) 40.0 and over, adult: Secondary | ICD-10-CM

## 2021-09-27 DIAGNOSIS — F320 Major depressive disorder, single episode, mild: Secondary | ICD-10-CM

## 2021-09-27 DIAGNOSIS — Z131 Encounter for screening for diabetes mellitus: Secondary | ICD-10-CM

## 2021-09-27 DIAGNOSIS — Z760 Encounter for issue of repeat prescription: Secondary | ICD-10-CM

## 2021-09-27 LAB — POCT GLYCOSYLATED HEMOGLOBIN (HGB A1C): Hemoglobin A1C: 5.4 % (ref 4.0–5.6)

## 2021-09-27 MED ORDER — ESCITALOPRAM OXALATE 20 MG PO TABS: 20.0000 mg | ORAL_TABLET | Freq: Every day | ORAL | 1 refills | Status: DC

## 2021-09-27 NOTE — Patient Instructions (Signed)

## 2021-09-27 NOTE — Progress Notes (Signed)
[facility] Established Patient Office Visit  Subjective:  Patient ID: Tony Shelton, male    DOB: 08-31-1986  Age: 35 y.o. MRN: [id number]  CC:  Chief Complaint  Patient presents with   Medication Refill    HPI Tony Shelton is a 35 year old morbid obese male who presents for medication refills. He voices no complains or concerns at this time.  No past medical history on file.  No past surgical history on file.  Family History  Problem Relation Age of Onset   Diabetes Mother    Diabetes Maternal Grandmother     Social History   Socioeconomic History   Marital status: Single    Spouse name: Not on file   Number of children: Not on file   Years of education: Not on file   Highest education level: Not on file  Occupational History   Not on file  Tobacco Use   Smoking status: Former   Smokeless tobacco: Never  Substance and Sexual Activity   Alcohol use: Yes   Drug use: No   Sexual activity: Not on file  Other Topics Concern   Not on file  Social History Narrative   Not on file   Social Determinants of Health   Financial Resource Strain: Not on file  Food Insecurity: Not on file  Transportation Needs: Not on file  Physical Activity: Not on file  Stress: Not on file  Social Connections: Not on file  Intimate Partner Violence: Not on file    Outpatient Medications Prior to Visit  Medication Sig Dispense Refill   escitalopram (LEXAPRO) 20 MG tablet Take 1 tablet (20 mg total) by mouth daily. 21 tablet 0   No facility-administered medications prior to visit.    No Known Allergies  ROS Review of Systems  All other systems reviewed and are negative.    Objective:    Physical Exam Vitals reviewed.  Constitutional:      Appearance: He is obese.  HENT:     Head: Normocephalic.     Right Ear: Tympanic membrane and external ear normal.     Left Ear: Tympanic membrane and external ear normal.     Nose:  Nose normal.  Eyes:     Extraocular Movements: Extraocular movements intact.     Pupils: Pupils are equal, round, and reactive to light.  Cardiovascular:     Rate and Rhythm: Normal rate and regular rhythm.  Pulmonary:     Effort: Pulmonary effort is normal.     Breath sounds: Normal breath sounds.  Abdominal:     General: Bowel sounds are normal. There is distension.     Palpations: Abdomen is soft.  Musculoskeletal:        General: Normal range of motion.     Cervical back: Normal range of motion and neck supple.  Skin:    General: Skin is warm and dry.  Neurological:     Mental Status: He is oriented to person, place, and time.  Psychiatric:        Mood and Affect: Mood normal.        Behavior: Behavior normal.        Thought Content: Thought content normal.        Judgment: Judgment normal.   BP 108/73 (BP Location: Right Arm, Patient Position: Sitting, Cuff Size: Large)   Pulse 71   Temp (!) 92.1 F (33.4 C) (Temporal)   Ht 5\' 4"  (1.626 m)   Wt 258  lb (117 kg)   SpO2 92%   BMI 44.29 kg/m  Wt Readings from Last 3 Encounters:  09/27/21 258 lb (117 kg)  10/18/19 234 lb 3.2 oz (106.2 kg)  09/20/19 234 lb 9.6 oz (106.4 kg)     Health Maintenance Due  Topic Date Due   COVID-19 Vaccine (1) Never done    There are no preventive care reminders to display for this patient.  Lab Results  Component Value Date   TSH 1.380 02/15/2018   Lab Results  Component Value Date   WBC 7.3 02/15/2018   HGB 15.2 02/15/2018   HCT 43.8 02/15/2018   MCV 89 02/15/2018   PLT 278 02/15/2018   Lab Results  Component Value Date   NA 141 02/15/2018   K 4.6 02/15/2018   CO2 23 02/15/2018   GLUCOSE 86 02/15/2018   BUN 8 02/15/2018   CREATININE 0.90 02/15/2018   BILITOT 0.5 03/01/2018   ALKPHOS 73 03/01/2018   AST 31 03/01/2018   ALT 48 (H) 03/01/2018   PROT 8.3 03/01/2018   ALBUMIN 4.8 03/01/2018   CALCIUM 9.3 02/15/2018   ANIONGAP 6 09/01/2016   Lab Results  Component  Value Date   CHOL 159 02/15/2018   Lab Results  Component Value Date   HDL 45 02/15/2018   Lab Results  Component Value Date   LDLCALC 94 02/15/2018   Lab Results  Component Value Date   TRIG 99 02/15/2018   Lab Results  Component Value Date   CHOLHDL 3.5 02/15/2018   Lab Results  Component Value Date   HGBA1C 5.4 09/27/2021      Assessment & Plan:  Tony Shelton was seen today for medication refill.  Diagnoses and all orders for this visit:  Screening for diabetes mellitus -     HgB A1c 5.4 per ADA guidelines he is not a diabetic . However, made aware of eating habits and weight can be a pre-existing factor.  Current mild episode of major depressive disorder, unspecified whether recurrent (HCC) He has felt on medication depression has been improving less thoughts of feelings down and trying to engage in more daily activities. Refilled as requested also, explain he could not abruptly stop medication.  -     escitalopram (LEXAPRO) 20 MG tablet; Take 1 tablet (20 mg total) by mouth daily.  Class 3 severe obesity with serious comorbidity and body mass index (BMI) of 40.0 to 44.9 in adult, unspecified obesity type (HCC) Morbid Obesity is > 40 BMI indicating an excess in caloric intake or underlining conditions. This may lead to other co-morbidities. Lifestyle modifications of diet and exercise may reduce obesity.   -     Lipid panel; Future -     CBC with Differential/Platelet; Future -     TSH + free T4; Future  Medication refill -     escitalopram (LEXAPRO) 20 MG tablet; Take 1 tablet (20 mg total) by mouth daily.   Meds ordered this encounter  Medications   escitalopram (LEXAPRO) 20 MG tablet    Sig: Take 1 tablet (20 mg total) by mouth daily.    Dispense:  90 tablet    Refill:  1    Follow-up: Return for labs after schedule with Mikle Bosworth.    Grayce Sessions, NP

## 2021-10-01 ENCOUNTER — Encounter (INDEPENDENT_AMBULATORY_CARE_PROVIDER_SITE_OTHER): Payer: Self-pay

## 2021-10-01 ENCOUNTER — Encounter (INDEPENDENT_AMBULATORY_CARE_PROVIDER_SITE_OTHER): Payer: Self-pay | Admitting: Primary Care

## 2022-07-15 ENCOUNTER — Telehealth: Payer: Self-pay | Admitting: Licensed Clinical Social Worker

## 2022-07-15 NOTE — Telephone Encounter (Signed)
LCSW spoke with patient and his friend that called regarding Mental health services. It was indicated that patient needs medication management and determined that [facility] does not provide appropriate level of care. LCSW provided information on potential resources, including [facility] and [employer], and [employer].

## 2022-07-17 ENCOUNTER — Ambulatory Visit (INDEPENDENT_AMBULATORY_CARE_PROVIDER_SITE_OTHER): Payer: Self-pay | Admitting: *Deleted

## 2022-07-17 ENCOUNTER — Other Ambulatory Visit (INDEPENDENT_AMBULATORY_CARE_PROVIDER_SITE_OTHER): Payer: Self-pay | Admitting: Primary Care

## 2022-07-17 DIAGNOSIS — Z760 Encounter for issue of repeat prescription: Secondary | ICD-10-CM

## 2022-07-17 DIAGNOSIS — F320 Major depressive disorder, single episode, mild: Secondary | ICD-10-CM

## 2022-07-17 NOTE — Telephone Encounter (Signed)
Chief Complaint: feeling more depressed would like to restart lexapro  Symptoms: feeling overwhelmed , depressed not wanting to get out of bed. Denies wanting to harm self . Is at work now wants refill of medication lexapro. Stopped taking bc he was getting better and thought he did not need it and now sees he does. Wants to speak to a counselor Frequency: 2 months  Pertinent Negatives: Patient denies chest pain no difficulty breathing no thoughts of harming self  Disposition: [] ED /[] Urgent Care (no appt availability in office) / [x] Appointment(In office/virtual)/ []  [facility]/ [] Home Care/ [] Refused Recommended Disposition /[] [facility]/ [x]  Follow-up with PCP Additional Notes:   Earliest appt scheduled for 08/11/22. Please advise for earlier appt. Or assistance with counselor  gave # to Urgent Crisis center. Patient at work at this time.    Reason for Disposition  Requesting to talk to a counselor (e.g., mental health worker, psychiatrist)  Answer Assessment - Initial Assessment Questions 1. CONCERN: "Did anything happen that prompted you to call today?"      Stopped taking lexapro and now would like to restart due to increasing sx of depression and feeling overwhelmed 2. ANXIETY SYMPTOMS: "Can you describe how you (your loved one; patient) have been feeling?" (e.g., tense, restless, panicky, anxious, keyed up, overwhelmed, sense of impending doom).      Overwhelmed , depression not wanting to get out of bed, is at work now   3. ONSET: "How long have you been feeling this way?" (e.g., hours, days, weeks)     2 months  4. SEVERITY: "How would you rate the level of anxiety?" (e.g., 0 - 10; or mild, moderate, severe).     Did not rate  5. FUNCTIONAL IMPAIRMENT: "How have these feelings affected your ability to do daily activities?" "Have you had more difficulty than usual doing your normal daily activities?" (e.g., getting better, same, worse; self-care, school,  work, interactions)     Is at work now  6. HISTORY: "Have you felt this way before?" "Have you ever been diagnosed with an anxiety problem in the past?" (e.g., generalized anxiety disorder, panic attacks, PTSD). If Yes, ask: "How was this problem treated?" (e.g., medicines, counseling, etc.)     Yes  7. RISK OF HARM - SUICIDAL IDEATION: "Do you ever have thoughts of hurting or killing yourself?" If Yes, ask:  "Do you have these feelings now?" "Do you have a plan on how you would do this?"     Denies  8. TREATMENT:  "What has been done so far to treat this anxiety?" (e.g., medicines, relaxation strategies). "What has helped?"     Medicines. 9. TREATMENT - THERAPIST: "Do you have a counselor or therapist? Name?"     No wants to talk to Mrs. Edwards  10. POTENTIAL TRIGGERS: "Do you drink caffeinated beverages (e.g., coffee, colas, teas), and how much daily?" "Do you drink alcohol or use any drugs?" "Have you started any new medicines recently?"       na 11. PATIENT SUPPORT: "Who is with you now?" "Who do you live with?" "Do you have family or friends who you can talk to?"        Friends , Shannon 12. OTHER SYMPTOMS: "Do you have any other symptoms?" (e.g., feeling depressed, trouble concentrating, trouble sleeping, trouble breathing, palpitations or fast heartbeat, chest pain, sweating, nausea, or diarrhea)       Depressed. 13. PREGNANCY: "Is there any chance you are pregnant?" "When was your last  menstrual period?"       na  Protocols used: Anxiety and Panic Attack-A-AH

## 2022-07-17 NOTE — Telephone Encounter (Signed)
Copied from CRM [id number]. Topic: General - Other >> Jul 17, 2022  8:52 AM Everette C wrote: Reason for CRM: Medication Refill - Medication: escitalopram (LEXAPRO) 20 MG tablet [[id number]]   Has the patient contacted their pharmacy? No.  (Agent: If no, request that the patient contact the pharmacy for the refill. If patient does not wish to contact the pharmacy document the reason why and proceed with request.) (Agent: If yes, when and what did the pharmacy advise?)  Preferred Pharmacy (with phone number or street name): [id number] [place],  - [address] [place] [address] Phone: [phone] Fax: [phone] Hours: Not open 24 hours  Has the patient been seen for an appointment in the last year OR does the patient have an upcoming appointment? Yes.    Agent: Please be advised that RX refills may take up to 3 business days. We ask that you follow-up with your pharmacy.

## 2022-07-18 MED ORDER — ESCITALOPRAM OXALATE 20 MG PO TABS: 20.0000 mg | ORAL_TABLET | Freq: Every day | ORAL | 0 refills | Status: DC

## 2022-07-18 NOTE — Telephone Encounter (Signed)
Courtesy refill. Patient must keep upcoming appointment for further refills. Requested Prescriptions  Pending Prescriptions Disp Refills  . escitalopram (LEXAPRO) 20 MG tablet 30 tablet 0    Sig: Take 1 tablet (20 mg total) by mouth daily.     Psychiatry:  Antidepressants - SSRI Failed - 07/17/2022 11:25 AM      Failed - Valid encounter within last 6 months    Recent Outpatient Visits          9 months ago Screening for diabetes mellitus   [facility] Grayce Sessions, NP   2 years ago Current mild episode of major depressive disorder, unspecified whether recurrent (HCC)   [facility] Grayce Sessions, NP   2 years ago Mood disorder in conditions classified elsewhere   [facility] Grayce Sessions, NP   2 years ago Mood disorder in conditions classified elsewhere   [facility] Grayce Sessions, NP   2 years ago Screening for diabetes mellitus   [facility] Grayce Sessions, NP      Future Appointments            In 3 weeks Randa Evens, Kinnie Scales, NP [facility]           Passed - Completed PHQ-2 or PHQ-9 in the last 360 days

## 2022-08-11 ENCOUNTER — Encounter (INDEPENDENT_AMBULATORY_CARE_PROVIDER_SITE_OTHER): Payer: Self-pay | Admitting: Primary Care

## 2022-08-11 ENCOUNTER — Telehealth (INDEPENDENT_AMBULATORY_CARE_PROVIDER_SITE_OTHER): Payer: Self-pay | Admitting: Primary Care

## 2022-08-11 DIAGNOSIS — F320 Major depressive disorder, single episode, mild: Secondary | ICD-10-CM

## 2022-08-11 DIAGNOSIS — Z760 Encounter for issue of repeat prescription: Secondary | ICD-10-CM

## 2022-08-11 MED ORDER — ESCITALOPRAM OXALATE 20 MG PO TABS: 20.0000 mg | ORAL_TABLET | Freq: Every day | ORAL | 1 refills | Status: DC

## 2022-08-11 NOTE — Progress Notes (Signed)
  [facility]  Virtual Visit via Telephone Note  I connected with Tony Shelton, on 08/11/2022 at 3:20 PM video application overified that I am speaking with the correct person using two identifiers.   Consent: I discussed the limitations, risks, security and privacy concerns of performing an evaluation and management service by telephone and the availability of in person appointments. I also discussed with the patient that there may be a patient responsible charge related to this service. The patient expressed understanding and agreed to proceed.   Location of Patient: [place] of Provider: [facility]   Persons participating in Telemedicine visit: Tony Shelton Gwinda Passe,  NP Cristela Felt , CMA     History of Present Illness:  Mr.Tony Shelton is a 36 year old who is having a virtual visit for medication refill. He stopped taking his Lexapro because he felt he was ok and no longer needed it. Later he realized his depression and anxiety was worst. He decided the medication actually was helping him.  He denies harm to self or other, auditory or visual hallucinations. History reviewed. No pertinent past medical history. No Known Allergies  No current outpatient medications on file prior to visit.   No current facility-administered medications on file prior to visit.    Observations/Objective: Comprehensive ROS Pertinent positive and negative noted in HPI    Assessment and Plan: Diagnoses and all orders for this visit:  Current mild episode of major depressive disorder, unspecified whether recurrent (HCC) He has felt on lexapro his depression was improving less thoughts of feelings down and trying to engage in more daily activities. Refilled as requested also, explain he could not abruptly stop medication.  -   Medication refill -     escitalopram (LEXAPRO)  20 MG tablet; Take 1 tablet (20 mg total) by mouth daily.     Follow Up Instructions:    I discussed the assessment and treatment plan with the patient. The patient was provided an opportunity to ask questions and all were answered. The patient agreed with the plan and demonstrated an understanding of the instructions.   The patient was advised to call back or seek an in-person evaluation if the symptoms worsen or if the condition fails to improve as anticipated.     I provided 15  minutes total of face-to-face time during this encounter including median intraservice time, reviewing previous notes, investigations, ordering medications, medical decision making, coordinating care and patient verbalized understanding at the end of the visit.    This note has been created with Education officer, environmental. Any transcriptional errors are unintentional.   Grayce Sessions, NP 08/11/2022, 3:20 PM

## 2022-10-13 ENCOUNTER — Telehealth (INDEPENDENT_AMBULATORY_CARE_PROVIDER_SITE_OTHER): Payer: Self-pay | Admitting: Primary Care

## 2023-01-27 ENCOUNTER — Ambulatory Visit (INDEPENDENT_AMBULATORY_CARE_PROVIDER_SITE_OTHER): Payer: Self-pay | Admitting: Primary Care

## 2023-02-02 ENCOUNTER — Ambulatory Visit (INDEPENDENT_AMBULATORY_CARE_PROVIDER_SITE_OTHER): Payer: Self-pay | Admitting: Primary Care

## 2023-04-20 ENCOUNTER — Other Ambulatory Visit (INDEPENDENT_AMBULATORY_CARE_PROVIDER_SITE_OTHER): Payer: Self-pay

## 2023-04-20 ENCOUNTER — Telehealth (INDEPENDENT_AMBULATORY_CARE_PROVIDER_SITE_OTHER): Payer: Self-pay | Admitting: Primary Care

## 2023-04-20 DIAGNOSIS — F320 Major depressive disorder, single episode, mild: Secondary | ICD-10-CM

## 2023-04-20 DIAGNOSIS — Z760 Encounter for issue of repeat prescription: Secondary | ICD-10-CM

## 2023-04-20 MED ORDER — ESCITALOPRAM OXALATE 20 MG PO TABS: 20.0000 mg | ORAL_TABLET | Freq: Every day | ORAL | 0 refills | Status: DC

## 2023-04-20 NOTE — Telephone Encounter (Signed)
Rx hs been sent. Per pcp last refill till pt has an appt

## 2023-04-20 NOTE — Telephone Encounter (Signed)
Medication Refill - Medication: escitalopram (LEXAPRO) 20 MG tablet   Has the patient contacted their pharmacy? No.   Preferred Pharmacy (with phone number or street name):  [id number] [place], [place] - [address] Phone: [phone]  Fax: [phone]     Has the patient been seen for an appointment in the last year OR does the patient have an upcoming appointment? Yes.    Please assist patient further

## 2023-05-22 ENCOUNTER — Other Ambulatory Visit (INDEPENDENT_AMBULATORY_CARE_PROVIDER_SITE_OTHER): Payer: Self-pay | Admitting: Primary Care

## 2023-05-22 DIAGNOSIS — Z760 Encounter for issue of repeat prescription: Secondary | ICD-10-CM

## 2023-05-22 DIAGNOSIS — F320 Major depressive disorder, single episode, mild: Secondary | ICD-10-CM

## 2023-05-22 NOTE — Telephone Encounter (Signed)
Requested medications are due for refill today.  yes  Requested medications are on the active medications list.  yes  Last refill. 04/20/2023 #30 0 rf  Future visit scheduled.   no  Notes to clinic.  Pt already given a courtesy refill.     Requested Prescriptions  Pending Prescriptions Disp Refills   escitalopram (LEXAPRO) 20 MG tablet [Pharmacy Med Name: ESCITALOPRAM 20 MG TABLET] 30 tablet 0    Sig: TAKE 1 TABLET BY MOUTH EVERY DAY     Psychiatry:  Antidepressants - SSRI Failed - 05/22/2023  2:24 AM      Failed - Completed PHQ-2 or PHQ-9 in the last 360 days      Failed - Valid encounter within last 6 months    Recent Outpatient Visits           9 months ago Current mild episode of major depressive disorder, unspecified whether recurrent (HCC)   [place] [facility] Grayce Sessions, NP   1 year ago Screening for diabetes mellitus   [place] [facility] Grayce Sessions, NP   2 years ago Current mild episode of major depressive disorder, unspecified whether recurrent (HCC)   [place] [facility] Grayce Sessions, NP   3 years ago Mood disorder in conditions classified elsewhere   [place] [facility] Grayce Sessions, NP   3 years ago Mood disorder in conditions classified elsewhere   [place] [facility] Grayce Sessions, NP

## 2023-06-09 ENCOUNTER — Other Ambulatory Visit (INDEPENDENT_AMBULATORY_CARE_PROVIDER_SITE_OTHER): Payer: Self-pay | Admitting: Primary Care

## 2023-06-09 DIAGNOSIS — F320 Major depressive disorder, single episode, mild: Secondary | ICD-10-CM

## 2023-06-09 DIAGNOSIS — Z760 Encounter for issue of repeat prescription: Secondary | ICD-10-CM

## 2023-06-16 ENCOUNTER — Ambulatory Visit (INDEPENDENT_AMBULATORY_CARE_PROVIDER_SITE_OTHER): Payer: Self-pay | Admitting: Primary Care

## 2023-06-16 VITALS — BP 97/66 | HR 66 | Resp 16 | Wt 261.8 lb

## 2023-06-16 DIAGNOSIS — Z79899 Other long term (current) drug therapy: Secondary | ICD-10-CM

## 2023-06-16 DIAGNOSIS — Z6841 Body Mass Index (BMI) 40.0 and over, adult: Secondary | ICD-10-CM

## 2023-06-16 DIAGNOSIS — F331 Major depressive disorder, recurrent, moderate: Secondary | ICD-10-CM

## 2023-06-16 MED ORDER — ESCITALOPRAM OXALATE 10 MG PO TABS: 10.0000 mg | ORAL_TABLET | Freq: Every day | ORAL | 1 refills | Status: DC

## 2023-06-16 NOTE — Progress Notes (Signed)
  [facility]  Tony Shelton, is a 37 y.o. male  ZOX:[id number]  WJX:[id number]  DOB - Dec 07, 1986  Chief Complaint  Patient presents with   Depression       Subjective:   [name]  ( girlfriend Tony Shelton allowed to be present at his visit. He is here today for a follow up visit for depression. He has been a lot better on lexapro he did not realize it until he was out for several day started having a headache and more irritable. He denies suicidal ideation , harm to others or hallucination visual or auditory. Patient has No headache, No chest pain, No abdominal pain - No Nausea, No new weakness tingling or numbness, No Cough - shortness of breath. Bp is low encourage intake of water   No problems updated.  No Known Allergies  No past medical history on file.  No current outpatient medications on file prior to visit.   No current facility-administered medications on file prior to visit.    Objective:   Vitals:   06/16/23 0845  BP: 97/66  Pulse: 66  Resp: 16  SpO2: 95%  Weight: 261 lb 12.8 oz (118.8 kg)    Comprehensive ROS Pertinent positive and negative noted in HPI   Exam General appearance : Awake, alert, not in any distress. Speech Clear. Not toxic looking HEENT: Atraumatic and Normocephalic, pupils equally reactive to light and accomodation Neck: Supple, no JVD. No cervical lymphadenopathy.  Chest: Good air entry bilaterally, no added sounds  CVS: S1 S2 regular, no murmurs.  Abdomen: Bowel sounds present, Non tender and not distended with no gaurding, rigidity or rebound. Extremities: B/L Lower Ext shows no edema, both legs are warm to touch Neurology: Awake alert, and oriented X 3, CN II-XII intact, Non focal Skin: No Rash  Data Review Lab Results  Component Value Date   HGBA1C 5.4 09/27/2021   HGBA1C 5.2 09/20/2019   HGBA1C 5.4 02/15/2018    Assessment & Plan  Tony Shelton was  seen today for depression.  Diagnoses and all orders for this visit:  Moderate episode of recurrent major depressive disorder (HCC) Manage on lexapro 10mg  refilling at this visit   Class 3 severe obesity with serious comorbidity and body mass index (BMI) of 40.0 to 44.9 in adult, unspecified obesity type (HCC) Discussed process foods and fast foods and increasing excercising. -     Lipid Panel  Medication management -     CMP14+EGFR -     CBC with Differential  Other orders -     escitalopram (LEXAPRO) 10 MG tablet; Take 1 tablet (10 mg total) by mouth daily.  Patient have been counseled extensively about nutrition and exercise. Other issues discussed during this visit include: low cholesterol diet, weight control and daily exercise, foot care, annual eye examinations at Ophthalmology, importance of adherence with medications and regular follow-up. We also discussed long term complications of uncontrolled diabetes and hypertension.   Return in 2 months (on 08/16/2023).  The patient was given clear instructions to go to ER or return to medical center if symptoms don't improve, worsen or new problems develop. The patient verbalized understanding. The patient was told to call to get lab results if they haven't heard anything in the next week.   This note has been created with Education officer, environmental. Any transcriptional errors are unintentional.   Grayce Sessions, NP 06/16/2023, 8:03 PM

## 2023-06-16 NOTE — Patient Instructions (Signed)
  Place anxiety patient instructions here. Place depression patient instructions here.  

## 2023-06-17 LAB — CMP14+EGFR
ALT: 47 IU/L — ABNORMAL HIGH (ref 0–44)
AST: 33 IU/L (ref 0–40)
Albumin: 4.5 g/dL (ref 4.1–5.1)
Alkaline Phosphatase: 69 IU/L (ref 44–121)
BUN/Creatinine Ratio: 18 (ref 9–20)
BUN: 15 mg/dL (ref 6–20)
Bilirubin Total: 0.5 mg/dL (ref 0.0–1.2)
CO2: 22 mmol/L (ref 20–29)
Calcium: 9.4 mg/dL (ref 8.7–10.2)
Chloride: 102 mmol/L (ref 96–106)
Creatinine, Ser: 0.85 mg/dL (ref 0.76–1.27)
Globulin, Total: 3.4 g/dL (ref 1.5–4.5)
Glucose: 91 mg/dL (ref 70–99)
Potassium: 4.5 mmol/L (ref 3.5–5.2)
Sodium: 138 mmol/L (ref 134–144)
Total Protein: 7.9 g/dL (ref 6.0–8.5)
eGFR: 117 mL/min/{1.73_m2} (ref >59)

## 2023-06-17 LAB — CBC WITH DIFFERENTIAL/PLATELET
Basophils Absolute: 0.1 10*3/uL (ref 0.0–0.2)
Basos: 1 %
EOS (ABSOLUTE): 0.7 10*3/uL — ABNORMAL HIGH (ref 0.0–0.4)
Eos: 7 %
Hematocrit: 45.6 % (ref 37.5–51.0)
Hemoglobin: 15.6 g/dL (ref 13.0–17.7)
Immature Grans (Abs): 0 10*3/uL (ref 0.0–0.1)
Immature Granulocytes: 0 %
Lymphocytes Absolute: 3.1 10*3/uL (ref 0.7–3.1)
Lymphs: 34 %
MCH: 30.6 pg (ref 26.6–33.0)
MCHC: 34.2 g/dL (ref 31.5–35.7)
MCV: 89 fL (ref 79–97)
Monocytes Absolute: 0.7 10*3/uL (ref 0.1–0.9)
Monocytes: 8 %
Neutrophils Absolute: 4.5 10*3/uL (ref 1.4–7.0)
Neutrophils: 50 %
Platelets: 253 10*3/uL (ref 150–450)
RBC: 5.1 x10E6/uL (ref 4.14–5.80)
RDW: 13 % (ref 11.6–15.4)
WBC: 9 10*3/uL (ref 3.4–10.8)

## 2023-06-17 LAB — LIPID PANEL
Chol/HDL Ratio: 3.9 ratio (ref 0.0–5.0)
Cholesterol, Total: 168 mg/dL (ref 100–199)
HDL: 43 mg/dL (ref >39)
LDL Chol Calc (NIH): 95 mg/dL (ref 0–99)
Triglycerides: 171 mg/dL — ABNORMAL HIGH (ref 0–149)
VLDL Cholesterol Cal: 30 mg/dL (ref 5–40)

## 2023-06-29 ENCOUNTER — Encounter (INDEPENDENT_AMBULATORY_CARE_PROVIDER_SITE_OTHER): Payer: Self-pay | Admitting: Primary Care

## 2023-08-19 ENCOUNTER — Ambulatory Visit (INDEPENDENT_AMBULATORY_CARE_PROVIDER_SITE_OTHER): Payer: Self-pay | Admitting: Primary Care

## 2023-08-19 ENCOUNTER — Encounter (INDEPENDENT_AMBULATORY_CARE_PROVIDER_SITE_OTHER): Payer: Self-pay

## 2023-09-03 ENCOUNTER — Ambulatory Visit (INDEPENDENT_AMBULATORY_CARE_PROVIDER_SITE_OTHER): Payer: Self-pay | Admitting: Primary Care

## 2023-09-03 VITALS — BP 104/69 | HR 61 | Resp 16 | Ht 64.0 in | Wt 257.4 lb

## 2023-09-03 DIAGNOSIS — F320 Major depressive disorder, single episode, mild: Secondary | ICD-10-CM

## 2023-09-03 NOTE — Patient Instructions (Addendum)
Take 2 lexapro until bottle is empty. New prescription sent for 20mg  take only 1. Calorie Counting for Weight Loss Calories are units of energy. Your body needs a certain number of calories from food to keep going throughout the day. When you eat or drink more calories than your body needs, your body stores the extra calories mostly as fat. When you eat or drink fewer calories than your body needs, your body burns fat to get the energy it needs. Calorie counting means keeping track of how many calories you eat and drink each day. Calorie counting can be helpful if you need to lose weight. If you eat fewer calories than your body needs, you should lose weight. Ask your health care provider what a healthy weight is for you. For calorie counting to work, you will need to eat the right number of calories each day to lose a healthy amount of weight per week. A dietitian can help you figure out how many calories you need in a day and will suggest ways to reach your calorie goal. A healthy amount of weight to lose each week is usually 1-2 lb (0.5-0.9 kg). This usually means that your daily calorie intake should be reduced by 500-750 calories. Eating 1,200-1,500 calories a day can help most women lose weight. Eating 1,500-1,800 calories a day can help most men lose weight. What do I need to know about calorie counting? Work with your health care provider or dietitian to determine how many calories you should get each day. To meet your daily calorie goal, you will need to: Find out how many calories are in each food that you would like to eat. Try to do this before you eat. Decide how much of the food you plan to eat. Keep a food log. Do this by writing down what you ate and how many calories it had. To successfully lose weight, it is important to balance calorie counting with a healthy lifestyle that includes regular activity. Where do I find calorie information?  The number of calories in a food can be  found on a Nutrition Facts label. If a food does not have a Nutrition Facts label, try to look up the calories online or ask your dietitian for help. Remember that calories are listed per serving. If you choose to have more than one serving of a food, you will have to multiply the calories per serving by the number of servings you plan to eat. For example, the label on a package of bread might say that a serving size is 1 slice and that there are 90 calories in a serving. If you eat 1 slice, you will have eaten 90 calories. If you eat 2 slices, you will have eaten 180 calories. How do I keep a food log? After each time that you eat, record the following in your food log as soon as possible: What you ate. Be sure to include toppings, sauces, and other extras on the food. How much you ate. This can be measured in cups, ounces, or number of items. How many calories were in each food and drink. The total number of calories in the food you ate. Keep your food log near you, such as in a pocket-sized notebook or on an app or website on your mobile phone. Some programs will calculate calories for you and show you how many calories you have left to meet your daily goal. What are some portion-control tips? Know how many calories are in a  serving. This will help you know how many servings you can have of a certain food. Use a measuring cup to measure serving sizes. You could also try weighing out portions on a kitchen scale. With time, you will be able to estimate serving sizes for some foods. Take time to put servings of different foods on your favorite plates or in your favorite bowls and cups so you know what a serving looks like. Try not to eat straight from a food's packaging, such as from a bag or box. Eating straight from the package makes it hard to see how much you are eating and can lead to overeating. Put the amount you would like to eat in a cup or on a plate to make sure you are eating the right  portion. Use smaller plates, glasses, and bowls for smaller portions and to prevent overeating. Try not to multitask. For example, avoid watching TV or using your computer while eating. If it is time to eat, sit down at a table and enjoy your food. This will help you recognize when you are full. It will also help you be more mindful of what and how much you are eating. What are tips for following this plan? Reading food labels Check the calorie count compared with the serving size. The serving size may be smaller than what you are used to eating. Check the source of the calories. Try to choose foods that are high in protein, fiber, and vitamins, and low in saturated fat, trans fat, and sodium. Shopping Read nutrition labels while you shop. This will help you make healthy decisions about which foods to buy. Pay attention to nutrition labels for low-fat or fat-free foods. These foods sometimes have the same number of calories or more calories than the full-fat versions. They also often have added sugar, starch, or salt to make up for flavor that was removed with the fat. Make a grocery list of lower-calorie foods and stick to it. Cooking Try to cook your favorite foods in a healthier way. For example, try baking instead of frying. Use low-fat dairy products. Meal planning Use more fruits and vegetables. One-half of your plate should be fruits and vegetables. Include lean proteins, such as chicken, [place], and fish. Lifestyle Each week, aim to do one of the following: 150 minutes of moderate exercise, such as walking. 75 minutes of vigorous exercise, such as running. General information Know how many calories are in the foods you eat most often. This will help you calculate calorie counts faster. Find a way of tracking calories that works for you. Get creative. Try different apps or programs if writing down calories does not work for you. What foods should I eat?  Eat nutritious foods. It is  better to have a nutritious, high-calorie food, such as an avocado, than a food with few nutrients, such as a bag of potato chips. Use your calories on foods and drinks that will fill you up and will not leave you hungry soon after eating. Examples of foods that fill you up are nuts and nut butters, vegetables, lean proteins, and high-fiber foods such as whole grains. High-fiber foods are foods with more than 5 g of fiber per serving. Pay attention to calories in drinks. Low-calorie drinks include water and unsweetened drinks. The items listed above may not be a complete list of foods and beverages you can eat. Contact a dietitian for more information. What foods should I limit? Limit foods or drinks that are not  good sources of vitamins, minerals, or protein or that are high in unhealthy fats. These include: Candy. Other sweets. Sodas, specialty coffee drinks, alcohol, and juice. The items listed above may not be a complete list of foods and beverages you should avoid. Contact a dietitian for more information. How do I count calories when eating out? Pay attention to portions. Often, portions are much larger when eating out. Try these tips to keep portions smaller: Consider sharing a meal instead of getting your own. If you get your own meal, eat only half of it. Before you start eating, ask for a container and put half of your meal into it. When available, consider ordering smaller portions from the menu instead of full portions. Pay attention to your food and drink choices. Knowing the way food is cooked and what is included with the meal can help you eat fewer calories. If calories are listed on the menu, choose the lower-calorie options. Choose dishes that include vegetables, fruits, whole grains, low-fat dairy products, and lean proteins. Choose items that are boiled, broiled, grilled, or steamed. Avoid items that are buttered, battered, fried, or served with cream sauce. Items labeled as  crispy are usually fried, unless stated otherwise. Choose water, low-fat milk, unsweetened iced tea, or other drinks without added sugar. If you want an alcoholic beverage, choose a lower-calorie option, such as a glass of wine or light beer. Ask for dressings, sauces, and syrups on the side. These are usually high in calories, so you should limit the amount you eat. If you want a salad, choose a garden salad and ask for grilled meats. Avoid extra toppings such as bacon, cheese, or fried items. Ask for the dressing on the side, or ask for olive oil and vinegar or lemon to use as dressing. Estimate how many servings of a food you are given. Knowing serving sizes will help you be aware of how much food you are eating at restaurants. Where to find more information Centers for Disease Control and Prevention: FootballExhibition.com.br U.[place]. Department of Agriculture: WrestlingReporter.dk Summary Calorie counting means keeping track of how many calories you eat and drink each day. If you eat fewer calories than your body needs, you should lose weight. A healthy amount of weight to lose per week is usually 1-2 lb (0.5-0.9 kg). This usually means reducing your daily calorie intake by 500-750 calories. The number of calories in a food can be found on a Nutrition Facts label. If a food does not have a Nutrition Facts label, try to look up the calories online or ask your dietitian for help. Use smaller plates, glasses, and bowls for smaller portions and to prevent overeating. Use your calories on foods and drinks that will fill you up and not leave you hungry shortly after a meal. This information is not intended to replace advice given to you by your health care provider. Make sure you discuss any questions you have with your health care provider. Document Revised: 01/26/2020 Document Reviewed: 01/26/2020 Elsevier Patient Education  2023 [employer].

## 2023-09-03 NOTE — Progress Notes (Signed)
  [facility]  Tony Shelton, is a 37 y.o. male  [id number]  [id number]  DOB - [birth year]  Chief Complaint  Patient presents with   Depression       Subjective:   Tony Shelton is a 37 y.o. male here today for a follow up visit for depression . Previous visit stared on Lexapro 10mg  he feels like it is not working and would like to increase dosage. Dose drop per patient request hoping it would work the same. He denies suicidal or homicidal ideation.No hallucination. He feels tired all the time just wants to sleep no motivation. Patient has No headache, No chest pain, No abdominal pain - No Nausea, No new weakness tingling or numbness, No Cough - shortness of breath. He mention medication for weight loss explain let's get depression under control than evaluate medications.  No problems updated.  No Known Allergies  No past medical history on file.  Current Outpatient Medications on File Prior to Visit  Medication Sig Dispense Refill   escitalopram (LEXAPRO) 10 MG tablet Take 1 tablet (10 mg total) by mouth daily. 90 tablet 1   No current facility-administered medications on file prior to visit.    Objective:   Vitals:   09/03/23 1426  BP: 104/69  Pulse: 61  Resp: 16  SpO2: 95%  Weight: 257 lb 6.4 oz (116.8 kg)  Height: 5\' 4"  (1.626 m)    Comprehensive ROS Pertinent positive and negative noted in HPI   Exam General appearance : Awake, alert, not in any distress. Speech Clear. Not toxic looking HEENT: Atraumatic and Normocephalic, pupils equally reactive to light and accomodation Neck: Supple, no JVD. No cervical lymphadenopathy.  Chest: Good air entry bilaterally, no added sounds  CVS: S1 S2 regular, no murmurs.  Abdomen: Bowel sounds present, Non tender and not distended with no gaurding, rigidity or rebound. Extremities: B/L Lower Ext shows no edema, both legs are warm to touch Neurology: Awake alert, and oriented  X 3, CN II-XII intact, Non focal Skin: No Rash  Data Review Lab Results  Component Value Date   HGBA1C 5.4 09/27/2021   HGBA1C 5.2 09/20/2019   HGBA1C 5.4 02/15/2018    Assessment & Plan  Tony Shelton was seen today for depression.  Diagnoses and all orders for this visit:  Current mild episode of major depressive disorder, unspecified whether recurrent (HCC) Increasing Lexapro to 20mg  daily.     Patient have been counseled extensively about nutrition and exercise. Other issues discussed during this visit include: low cholesterol diet, weight control and daily exercise, foot care, annual eye examinations at Ophthalmology, importance of adherence with medications and regular follow-up. We also discussed long term complications of uncontrolled diabetes and hypertension.   Return in about 2 months (around 11/03/2023) for Patient to call to inform if medication is working.  The patient was given clear instructions to go to ER or return to medical center if symptoms don't improve, worsen or new problems develop. The patient verbalized understanding. The patient was told to call to get lab results if they haven't heard anything in the next week.   This note has been created with Education officer, environmental. Any transcriptional errors are unintentional.   Grayce Sessions, NP 09/03/2023, 2:52 PM

## 2023-09-08 ENCOUNTER — Other Ambulatory Visit (INDEPENDENT_AMBULATORY_CARE_PROVIDER_SITE_OTHER): Payer: Self-pay

## 2023-09-08 MED ORDER — ESCITALOPRAM OXALATE 20 MG PO TABS: 20.0000 mg | ORAL_TABLET | Freq: Every day | ORAL | 1 refills | Status: DC

## 2024-03-15 ENCOUNTER — Other Ambulatory Visit (INDEPENDENT_AMBULATORY_CARE_PROVIDER_SITE_OTHER): Payer: Self-pay | Admitting: Primary Care

## 2024-03-15 NOTE — Telephone Encounter (Signed)
 Will forward to provider

## 2024-10-06 ENCOUNTER — Other Ambulatory Visit: Payer: Self-pay

## 2024-10-06 ENCOUNTER — Encounter: Payer: Self-pay | Admitting: Intensive Care

## 2024-10-06 ENCOUNTER — Emergency Department
Admission: EM | Admit: 2024-10-06 | Discharge: 2024-10-06 | Disposition: A | Discharge status: Home / Self Care | Payer: Worker's Compensation

## 2024-10-06 DIAGNOSIS — T304 Corrosion of unspecified body region, unspecified degree: Secondary | ICD-10-CM | POA: Insufficient documentation

## 2024-10-06 DIAGNOSIS — T543X1A Toxic effect of corrosive alkalis and alkali-like substances, accidental (unintentional), initial encounter: Secondary | ICD-10-CM | POA: Insufficient documentation

## 2024-10-06 MED ORDER — IBUPROFEN 600 MG PO TABS
600.0000 mg | ORAL_TABLET | Freq: Once | ORAL | Status: AC
  Administered 2024-10-06: 600 mg via ORAL
  Filled 2024-10-06: qty 1

## 2024-10-06 MED ORDER — ACETAMINOPHEN 500 MG PO TABS: 1000.0000 mg | ORAL_TABLET | Freq: Four times a day (QID) | ORAL | 2 refills | Status: AC | PRN

## 2024-10-06 MED ORDER — MUPIROCIN 2 % EX OINT: 1.0000 | TOPICAL_OINTMENT | Freq: Two times a day (BID) | CUTANEOUS | 0 refills | Status: AC

## 2024-10-06 MED ORDER — IBUPROFEN 200 MG PO TABS: 600.0000 mg | ORAL_TABLET | Freq: Three times a day (TID) | ORAL | 2 refills | Status: AC | PRN

## 2024-10-06 MED ORDER — ACETAMINOPHEN 500 MG PO TABS
1000.0000 mg | ORAL_TABLET | Freq: Once | ORAL | Status: AC
  Administered 2024-10-06: 1000 mg via ORAL
  Filled 2024-10-06: qty 2

## 2024-10-06 NOTE — ED Notes (Signed)
 

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
## 2024-10-06 NOTE — ED Triage Notes (Signed)
 While at work, patient got the Glass blower/designer hw on his right arm, right ear, and in mouth.   Patient coughing some in triage but nothing abnormal noted in mouth. Patient reports he can taste the chemical in his mouth.
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
